# Patient Record
Sex: Male | Born: 1940 | Race: Black or African American | Hispanic: No | Marital: Married | Smoking: Former smoker
Health system: Southern US, Community
[De-identification: ages and names within clinical notes are randomized; demographics above are authoritative.]

## PROBLEM LIST (undated history)

## (undated) DIAGNOSIS — I10 Essential (primary) hypertension: Secondary | ICD-10-CM

## (undated) DIAGNOSIS — N400 Enlarged prostate without lower urinary tract symptoms: Secondary | ICD-10-CM

## (undated) DIAGNOSIS — G4730 Sleep apnea, unspecified: Secondary | ICD-10-CM

## (undated) DIAGNOSIS — M1990 Unspecified osteoarthritis, unspecified site: Secondary | ICD-10-CM

## (undated) DIAGNOSIS — E785 Hyperlipidemia, unspecified: Secondary | ICD-10-CM

## (undated) DIAGNOSIS — C801 Malignant (primary) neoplasm, unspecified: Secondary | ICD-10-CM

## (undated) DIAGNOSIS — E119 Type 2 diabetes mellitus without complications: Secondary | ICD-10-CM

## (undated) HISTORY — DX: Sleep apnea, unspecified: G47.30

## (undated) HISTORY — DX: Essential (primary) hypertension: I10

## (undated) HISTORY — DX: Benign prostatic hyperplasia without lower urinary tract symptoms: N40.0

## (undated) HISTORY — PX: JOINT REPLACEMENT: SHX530

## (undated) HISTORY — PX: PROSTATE BIOPSY: SHX241

## (undated) HISTORY — DX: Hyperlipidemia, unspecified: E78.5

## (undated) HISTORY — PX: COLON SURGERY: SHX602

## (undated) HISTORY — PX: NEPHRECTOMY: SHX65

## (undated) HISTORY — PX: CYST REMOVAL HAND: SHX6279

---

## 2006-08-25 ENCOUNTER — Ambulatory Visit: Payer: Self-pay | Admitting: Urology

## 2011-01-16 ENCOUNTER — Emergency Department: Payer: Self-pay | Admitting: Internal Medicine

## 2013-03-04 ENCOUNTER — Emergency Department: Payer: Self-pay | Admitting: Emergency Medicine

## 2013-03-04 LAB — URINALYSIS, COMPLETE
Bacteria: NONE SEEN
Bilirubin,UR: NEGATIVE
Blood: NEGATIVE
Glucose,UR: NEGATIVE mg/dL (ref 0–75)
Ketone: NEGATIVE
Nitrite: NEGATIVE
Ph: 6 (ref 4.5–8.0)
Protein: 100
RBC,UR: 1 /HPF (ref 0–5)
Specific Gravity: 1.015 (ref 1.003–1.030)
Squamous Epithelial: NONE SEEN
WBC UR: <1 /HPF (ref 0–5)

## 2013-03-14 ENCOUNTER — Inpatient Hospital Stay: Payer: Self-pay | Admitting: Internal Medicine

## 2013-03-14 LAB — URINALYSIS, COMPLETE
Bacteria: NONE SEEN
Ketone: NEGATIVE
Nitrite: NEGATIVE
Ph: 6 (ref 4.5–8.0)
Protein: 100
RBC,UR: 6691 /HPF (ref 0–5)
Specific Gravity: 1.011 (ref 1.003–1.030)
Squamous Epithelial: NONE SEEN
WBC UR: 35 /HPF (ref 0–5)

## 2013-03-14 LAB — COMPREHENSIVE METABOLIC PANEL
Albumin: 3 g/dL — ABNORMAL LOW (ref 3.4–5.0)
Alkaline Phosphatase: 113 U/L (ref 50–136)
Anion Gap: 8 (ref 7–16)
Calcium, Total: 9.5 mg/dL (ref 8.5–10.1)
Chloride: 103 mmol/L (ref 98–107)
Creatinine: 1.85 mg/dL — ABNORMAL HIGH (ref 0.60–1.30)
EGFR (Non-African Amer.): 36 — ABNORMAL LOW
Glucose: 145 mg/dL — ABNORMAL HIGH (ref 65–99)
Osmolality: 282 (ref 275–301)
Potassium: 3.7 mmol/L (ref 3.5–5.1)
SGOT(AST): 19 U/L (ref 15–37)
SGPT (ALT): 26 U/L (ref 12–78)

## 2013-03-14 LAB — CBC WITH DIFFERENTIAL/PLATELET
Eosinophil #: 0.1 10*3/uL (ref 0.0–0.7)
HGB: 13.7 g/dL (ref 13.0–18.0)
MCH: 29.2 pg (ref 26.0–34.0)
MCHC: 33.6 g/dL (ref 32.0–36.0)
Monocyte %: 6.9 %
Neutrophil #: 7.8 10*3/uL — ABNORMAL HIGH (ref 1.4–6.5)
Platelet: 165 10*3/uL (ref 150–440)
RBC: 4.71 10*6/uL (ref 4.40–5.90)
WBC: 9.4 10*3/uL (ref 3.8–10.6)

## 2013-03-14 LAB — TROPONIN I: Troponin-I: 0.1 ng/mL — ABNORMAL HIGH

## 2013-03-14 LAB — CK TOTAL AND CKMB (NOT AT ARMC): CK-MB: 3.3 ng/mL (ref 0.5–3.6)

## 2013-03-14 LAB — CK-MB: CK-MB: 2.3 ng/mL (ref 0.5–3.6)

## 2013-03-15 LAB — CBC WITH DIFFERENTIAL/PLATELET
Basophil #: 0.1 10*3/uL (ref 0.0–0.1)
HCT: 39.5 % — ABNORMAL LOW (ref 40.0–52.0)
HGB: 13 g/dL (ref 13.0–18.0)
Lymphocyte %: 15.8 %
MCHC: 33 g/dL (ref 32.0–36.0)
MCV: 88 fL (ref 80–100)
Neutrophil #: 7.3 10*3/uL — ABNORMAL HIGH (ref 1.4–6.5)
Neutrophil %: 71.4 %
Platelet: 172 10*3/uL (ref 150–440)
RBC: 4.52 10*6/uL (ref 4.40–5.90)
WBC: 10.2 10*3/uL (ref 3.8–10.6)

## 2013-03-15 LAB — LIPID PANEL
Ldl Cholesterol, Calc: 108 mg/dL — ABNORMAL HIGH (ref 0–100)
VLDL Cholesterol, Calc: 14 mg/dL (ref 5–40)

## 2013-03-15 LAB — BASIC METABOLIC PANEL
Anion Gap: 3 — ABNORMAL LOW (ref 7–16)
Co2: 29 mmol/L (ref 21–32)
Creatinine: 1.85 mg/dL — ABNORMAL HIGH (ref 0.60–1.30)
EGFR (African American): 42 — ABNORMAL LOW
Glucose: 126 mg/dL — ABNORMAL HIGH (ref 65–99)
Osmolality: 284 (ref 275–301)
Potassium: 4.5 mmol/L (ref 3.5–5.1)

## 2013-03-15 LAB — MAGNESIUM: Magnesium: 1.8 mg/dL

## 2013-03-15 LAB — TSH: Thyroid Stimulating Horm: 1.1 u[IU]/mL

## 2013-03-17 LAB — URINE CULTURE

## 2013-03-19 LAB — CULTURE, BLOOD (SINGLE)

## 2014-12-26 ENCOUNTER — Encounter: Payer: Self-pay | Admitting: *Deleted

## 2015-01-08 ENCOUNTER — Ambulatory Visit: Payer: Self-pay | Admitting: General Surgery

## 2015-01-18 ENCOUNTER — Ambulatory Visit (INDEPENDENT_AMBULATORY_CARE_PROVIDER_SITE_OTHER): Payer: Medicare Other | Admitting: General Surgery

## 2015-01-18 ENCOUNTER — Other Ambulatory Visit: Payer: Self-pay | Admitting: General Surgery

## 2015-01-18 ENCOUNTER — Encounter: Payer: Self-pay | Admitting: General Surgery

## 2015-01-18 VITALS — BP 130/70 | HR 76 | Resp 14 | Ht 71.0 in | Wt 253.0 lb

## 2015-01-18 DIAGNOSIS — Z1211 Encounter for screening for malignant neoplasm of colon: Secondary | ICD-10-CM | POA: Diagnosis not present

## 2015-01-18 MED ORDER — POLYETHYLENE GLYCOL 3350 17 GM/SCOOP PO POWD: ORAL | Status: DC

## 2015-01-18 NOTE — Patient Instructions (Addendum)
Colonoscopy A colonoscopy is an exam to look at the entire large intestine (colon). This exam can help find problems such as tumors, polyps, inflammation, and areas of bleeding. The exam takes about 1 hour.  LET YOUR HEALTH CARE PROVIDER KNOW ABOUT:   Any allergies you have.  All medicines you are taking, including vitamins, herbs, eye drops, creams, and over-the-counter medicines.  Previous problems you or members of your family have had with the use of anesthetics.  Any blood disorders you have.  Previous surgeries you have had.  Medical conditions you have. RISKS AND COMPLICATIONS  Generally, this is a safe procedure. However, as with any procedure, complications can occur. Possible complications include:  Bleeding.  Tearing or rupture of the colon wall.  Reaction to medicines given during the exam.  Infection (rare). BEFORE THE PROCEDURE   Ask your health care provider about changing or stopping your regular medicines.  You may be prescribed an oral bowel prep. This involves drinking a large amount of medicated liquid, starting the day before your procedure. The liquid will cause you to have multiple loose stools until your stool is almost clear or light green. This cleans out your colon in preparation for the procedure.  Do not eat or drink anything else once you have started the bowel prep, unless your health care provider tells you it is safe to do so.  Arrange for someone to drive you home after the procedure. PROCEDURE   You will be given medicine to help you relax (sedative).  You will lie on your side with your knees bent.  A long, flexible tube with a light and camera on the end (colonoscope) will be inserted through the rectum and into the colon. The camera sends video back to a computer screen as it moves through the colon. The colonoscope also releases carbon dioxide gas to inflate the colon. This helps your health care provider see the area better.  During  the exam, your health care provider may take a small tissue sample (biopsy) to be examined under a microscope if any abnormalities are found.  The exam is finished when the entire colon has been viewed. AFTER THE PROCEDURE   Do not drive for 24 hours after the exam.  You may have a small amount of blood in your stool.  You may pass moderate amounts of gas and have mild abdominal cramping or bloating. This is caused by the gas used to inflate your colon during the exam.  Ask when your test results will be ready and how you will get your results. Make sure you get your test results. Document Released: 09/12/2000 Document Revised: 07/06/2013 Document Reviewed: 05/23/2013 ExitCare Patient Information 2015 [employer]. This information is not intended to replace advice given to you by your health care provider. Make sure you discuss any questions you have with your health care provider.  Patient has been scheduled for a colonoscopy on 02-20-15 at [facility]. 

## 2015-01-18 NOTE — Progress Notes (Signed)
Patient ID: Gregory Ramos, male   DOB: 1941/04/13, 74 y.o.   MRN: [id number]  Chief Complaint  Patient presents with  . Colonoscopy    HPI Gregory Ramos is a 74 y.o. male.  Here today for colonoscopy discussion. He does not recall ever having a colonoscopy. Denies family history of colon cancer. Denies any gastrointestinal issues. Bowels move daily or every other day.  Contract work from 2003 - 2011 for the [employer]  Followed by Dr. Tresa Moore for his prostate at [facility] in [place]. Marland Kitchen  HPI  Past Medical History  Diagnosis Date  . Hypertension   . Hyperlipidemia   . Prostate enlargement   . Sleep apnea     resolved with weight loss    Past Surgical History  Procedure Laterality Date  . Cyst removal hand  19080  . Prostate biopsy  2000?    No family history on file.  Social History History  Substance Use Topics  . Smoking status: Former Smoker -- 1.00 packs/day for 15 years    Types: Cigarettes  . Smokeless tobacco: Never Used  . Alcohol Use: No    No Known Allergies  Current Outpatient Prescriptions  Medication Sig Dispense Refill  . alfuzosin (UROXATRAL) 10 MG 24 hr tablet Take 10 mg by mouth daily with breakfast.     . finasteride (PROSCAR) 5 MG tablet Take 5 mg by mouth daily.     Marland Kitchen lisinopril-hydrochlorothiazide (PRINZIDE,ZESTORETIC) 20-25 MG per tablet Take 1 tablet by mouth daily.     Marland Kitchen METOPROLOL TARTRATE PO Take 50 mg by mouth 2 (two) times daily.    . polyethylene glycol powder (GLYCOLAX/MIRALAX) powder 255 grams one bottle for colonoscopy prep 255 g 0   No current facility-administered medications for this visit.    Review of Systems Review of Systems  Constitutional: Negative.   Respiratory: Negative.   Cardiovascular: Negative.   Gastrointestinal: Negative.     Blood pressure 130/70, pulse 76, resp. rate 14, height 5\' 11"  (1.803 m), weight 253 lb (114.76 kg).  Physical Exam Physical Exam  Constitutional: He is oriented to person,  place, and time. He appears well-developed and well-nourished.  Neck: Neck supple.  Cardiovascular: Normal rate, regular rhythm and normal heart sounds.   Pulmonary/Chest: Effort normal and breath sounds normal.  Lymphadenopathy:    He has no cervical adenopathy.  Neurological: He is alert and oriented to person, place, and time.  Skin: Skin is warm and dry.    Data Reviewed PCP noted of December 14, 2014.   Labs notable for creatinine of 2.0 w/ e GFR of 37. Normal electrolytes. PSA 9.6.  Assessment    Candidate for screening colonoscopy.  History elevated PSA with negative prostatic biopsies in the past. Active follow-up with urology is in place.  Past history of sleep apnea requiring CPAP unit. By patient report this is no longer required.    Plan    Indications for screening colonoscopy were reviewed.    Colonoscopy with possible biopsy/polypectomy prn: Information regarding the procedure, including its potential risks and complications (including but not limited to perforation of the bowel, which may require emergency surgery to repair, and bleeding) was verbally given to the patient. Educational information regarding lower instestinal endoscopy was given to the patient. Written instructions for how to complete the bowel prep using Miralax were provided. The importance of drinking ample fluids to avoid dehydration as a result of the prep emphasized.  Patient has been scheduled for a colonoscopy on 02-20-15 at [facility].  PCP/Ref:  Faylene Kurtz 01/18/2015, 7:07 PM

## 2015-01-19 NOTE — Consult Note (Signed)
PATIENT NAME:  Gregory Ramos, Gregory Ramos MR#:  [id number] DATE OF BIRTH:  [birth year]  DATE OF CONSULTATION:  03/14/2013  REFERRING PHYSICIAN:  Dr. Margaretmary Eddy.  CONSULTING PHYSICIAN:  Scott C. Bernardo Heater, MD  REASON FOR CONSULTATION: Urinary retention, BPH.   HISTORY OF PRESENT ILLNESS: This is a 74 year old African American male who presented to the Emergency Department on 03/04/2013 with acute urinary retention and had a Foley catheter placed. I have seen him previously; however, he apparently could not get a followup appointment with me and saw Dr. Tresa Moore at [facility] in [place] last week. It was recommended that his catheter be left indwelling, and he was started on a "blue pill" which would "shrink his bladder and prostate." He has a followup appointment scheduled with Dr. Tresa Moore on June 26th; however, went to bed last night and was awakened with shaking chills and fever to 103 degrees. He was admitted for IV antibiotics. CT of the abdomen and pelvis without contrast showed severe bladder wall thickening and inflammatory changes. There was noted to be prostate enlargement. He was found to have bilateral sclerotic lesions of the ileum. His Foley catheter was removed in the Emergency Department. He states he has been voiding.   I initially saw him in 2007 for BPH and an elevated PSA. He had a prior prostate biopsy at the [place] in 2002 for a PSA of 5.1, with benign pathology. He underwent a repeat biopsy by me in 2010 for a PSA of 7.2. Prostate volume at that time was 122 mL. He was started on Avodart at that time; however, discontinued this medication in January 2012. He has been on alfuzosin. He was last seen by me on 12/24/2012. DRE at that time was benign. PSA at that visit was stable at 7.9. Bladder volume when the catheter was placed in the Emergency Room was 825 mL.   PAST MEDICAL HISTORY:  1. Hypertension.  2. BPH.   3. Elevated PSA.   PAST SURGERY: He has had prior surgery on digits of the upper  extremity.   ALLERGIES: NKDA.    MEDICATIONS ON ADMISSION: Lopressor 50 mg b.i.d., hydrochlorothiazide daily, amlodipine 5 mg daily, alfuzosin 10 mg daily.   SOCIAL HISTORY: No tobacco use. Denies alcohol use.   FAMILY HISTORY: Positive for hypertension.   REVIEW OF SYSTEMS:  GENERAL: Positive fever, fatigue, weakness.  EYES: No visual changes.  ENT: No epistaxis.  PULMONARY: Denies cough, shortness breath.  GASTROINTESTINAL: Denies nausea, vomiting, abdominal pain.  GENITOURINARY: As per the HPI.   HEMATOLOGY: No history of bleeding or clotting disorders.  MUSCULOSKELETAL: No joint pain.  NEUROLOGIC: No stroke, seizure.  PSYCHIATRIC: No depression, anxiety.   PHYSICAL EXAMINATION:  VITAL SIGNS: Temp 98.6, pulse 77, BP 150/79.  GENERAL: The patient is in no acute distress.  ABDOMEN: Soft and nontender.  PROSTATE: Exam was deferred at this time.   IMPRESSION:  1. Benign prostatic hypertrophy.  2. Urinary retention.  3. Acute prostatitis, most likely secondary to indwelling Foley.   RECOMMENDATIONS:  1. Continue IV antibiotics pending urine culture sensitivity.  2. A PSA in March 2014 was stable at 7.9. Would not repeat a PSA at this time with recent catheterization and infection.  3. His Foley catheter was removed in the Emergency Department. He needs to be followed closely for recurrent urinary retention and at present, the Foley catheter would need to be replaced.  4. Will follow.   ____________________________ Ronda Fairly Bernardo Heater, MD scs:gb D: 03/14/2013 19:49:39 ET T: 03/14/2013 22:50:03 ET JOB#:  [id number]  cc: Nicki Reaper C. Bernardo Heater, MD, <Dictator> Abbie Sons MD ELECTRONICALLY SIGNED 03/23/2013 13:20

## 2015-01-19 NOTE — Discharge Summary (Signed)
PATIENT NAME:  Gregory Ramos, Gregory Ramos MR#:  [id number] DATE OF BIRTH:  1941-01-21  DATE OF ADMISSION:  03/14/2013 DATE OF DISCHARGE:  03/15/2013  ADMISSION DIAGNOSES:  1.  Sepsis from acute cystitis/proctitis.  2.  Cystitis likely secondary to indwelling Foley catheter.  3.  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  4.  History of benign prostatic hypertrophy.  5.  Elevated troponin.   CONSULTANTS:  1.  Cardiology, Dr. Serafina Royals.  2.  Dr. John Giovanni.  DISCHARGE LABORATORY AND DIAGNOSTICS: LDL 108, VLDL 14, HDL 40, triglyceride 68, cholesterol 162.  White blood cells 10.2, hemoglobin 13, hematocrit 39.4, platelets 172. Sodium 140, potassium 4.5, chloride 108, bicarb 29, BUN 22, creatinine 1.85, glucose 26.  Magnesium 1.8. TSH 1.10. Troponin max 0.22, at discharge 0.14.  2-D echocardiogram showed an EF 55% to 60%, normal global left ventricular systolic function with mild mitral valve regurg, severely increased left ventricular posterior wall thickness.  CT of the abdomen and pelvis showed cystitis.  No acute abnormality.   HOSPITAL COURSE: This is a 74 year old male who presented with dehydration and sepsis. For further details, please refer to the H and P.  1.  Sepsis from acute cystitis and prostatitis. The patient initially presented with low blood pressure and elevated troponin.  A CT of the abdomen showed cystitis. Urology was consulted. They felt this may be more prostatitis. His blood cultures are negative to date. There was no urine culture. He was doing well on Rocephin. He was discharged with Keflex.  2.  Cystitis secondary to his Foley catheter that had been placed June 6th for BPH.  His Foley was DC'd and he actually urinated well and we continued him on antibiotics. He was afebrile throughout the hospitalization.  3.  Renal failure. Likely this is chronic as his creatinine remained stable. This is likely stage III.  4.  History of BPH.  We appreciate urology consult. We will  continue Flomax.  5.  Elevated troponin from demand ischemia. He initially presented with SVT which resolved in the ER. His echo was pretty much unremarkable and there is no further cardiac work-up at this time, per cardiology.  6.  SVT, which resolved.   DISCHARGE MEDICATIONS: 1.  Alfuzosin 10 mg daily.  2.  Finasteride 5 mg daily.  3.  HCTZ/lisinopril 25/20 mg daily.  4.  Metoprolol 50 mg b.i.d.  5.  Keflex 500 mg t.i.d. for 12 days.   DISCHARGE DIET: Low sodium.  DISCHARGE ACTIVITY: As tolerated.   DISCHARGE FOLLOWUP:  The patient can follow up with Dr. Bernardo Heater in 3 to 4 weeks and Dr. Brynda Greathouse in 1 to 2 weeks.   TIME SPENT: Approximately 35 minutes.  ____________________________ Donell Beers. Benjie Karvonen, MD spm:sb D: 03/15/2013 15:47:40 ET T: 03/15/2013 16:13:21 ET JOB#: [id number]  cc: Reann Dobias P. Benjie Karvonen, MD, <Dictator> Mikeal Hawthorne. Brynda Greathouse, MD Donell Beers Liza Czerwinski MD ELECTRONICALLY SIGNED 03/15/2013 20:32

## 2015-01-19 NOTE — Consult Note (Signed)
Brief Consult Note: Diagnosis: BPH/Urinary retention/Acute prostatitis.   Patient was seen by consultant.   Consult note dictated.   Comments: Catheter was removed in ED. Monitor for recurrent retention.  Electronic Signatures: Abbie Sons (MD)  (Signed 16-Jun-14 19:51)  Authored: Brief Consult Note   Last Updated: 16-Jun-14 19:51 by Abbie Sons (MD)

## 2015-01-19 NOTE — H&P (Signed)
PATIENT NAME:  Gregory Ramos, Gregory Ramos MR#:  [id number] DATE OF BIRTH:  [birth year]  DATE OF ADMISSION:  03/14/2013  PRIMARY CARE PHYSICIAN:  Nicky Pugh, MD  REFERRING PHYSICIAN:  Lurline Hare, MD  CHIEF COMPLAINT: Fever with chills.   HISTORY OF PRESENT ILLNESS: The patient is a 74 year old African American male with a past medical history of benign prostatic hypertrophy with history of urinary retention who had an indwelling Foley catheter placed from June 6th and hypertension who is presenting to the ER with the chief complaint of fever and chills. The patient was in deep sleep and awoke with fever and chills. He has been following up with some urologist and could not recall his name and he is taking some medicine to shrink the prostate, but not on any antibiotics. The patient could not recall his urologist's name. In the ER, the patient's temperature was 103 degrees Fahrenheit and he was tachycardic with SVT at 140. Eventually with medicine and IV fluids heart rate trended down to 120, but the patient was in accelerated junctional rhythm. Urinalysis was positive for infection, and the patient was given IV ciprofloxacin. CAT scan of the abdomen was done which has revealed acute cystitis. Renal function is slightly worse which has trended up from 1.61 to 1.85.  The patient's troponin is elevated at 0.1 with no acute ST-T elevations or depressions. The patient has reported that he felt a little short of breath when he was tachycardic and febrile, but during my examination he is absolutely fine and resting comfortably. Wife is at bedside. Denies any other complaints. No chest pain. Denies any abdominal pain, nausea, vomiting, weight gain or weight loss.   PAST MEDICAL HISTORY:  1.  Hypertension. 2.  Benign prostatic hypertrophy with history of urinary retention and has been followed up by urology locally for shrinking his prostate.   PAST SURGICAL HISTORY: Finger surgery.  ALLERGIES: No known drug allergies.    HOME MEDICATIONS:  1.  Lopressor 50 mg p.o. b.i.d. 2.  Hydrochlorothiazide once daily. 3.  Amlodipine 5 mg once daily. 4.  Alfuzosin 10 mg once daily.   PSYCHOSOCIAL HISTORY: Lives at home with wife. He used to smoke but quit smoking. Denies alcohol or illicit drug usage.   FAMILY HISTORY: Hypertension runs in his family.  Parents and brothers, all of them have hypertension. Also prostate hypertrophy history runs in his family.   REVIEW OF SYSTEMS: CONSTITUTIONAL: Complaining of fever, fatigue and weakness, but denies any pain, weight loss, weight gain.  EYES: Denies any blurry vision, glaucoma, cataracts.  ENT: Denies any epistaxis, discharge, snoring, postnasal drip, difficulty in swallowing. RESPIRATORY:  Denies any cough, wheezing, hemoptysis. The patient was short of breath transiently when he was febrile and tachycardic.  GASTROINTESTINAL: Denies nausea, vomiting, diarrhea, abdominal pain. No hematemesis.  GENITOURINARY: Has a history of prostate hypertrophy with urinary retention. Denies any hernia repair.   ENDOCRINE:  Denies any polyuria, nocturia or thyroid problems.  HEMATOLOGIC AND LYMPHATIC: Denies any anemia, easy bruising or bleeding.  INTEGUMENTARY: No acne, rash, lesions.  MUSCULOSKELETAL: No joint pain in the back, neck or shoulders. Denies any gout. Denies any limited activity. NEUROLOGIC:  Denies any vertigo, ataxia, dementia, headache or blurry vision. PSYCH:  Denies any ADD, OCD or bipolar disorder.   PHYSICAL EXAMINATION: VITAL SIGNS: Temperature 98.9, pulse 91, respirations 20, blood pressure 112/73 and pulse ox 98%.  GENERAL APPEARANCE: Not in acute distress. Moderately built and obese.  HEENT: Normocephalic, atraumatic. Pupils are equally reacting to light and  accommodation. No scleral icterus. No conjunctival injection. No postnasal drip.  No pharyngeal exudates.  NECK: Supple. No JVD. No thyromegaly. No lymphadenopathy.  LUNGS: Clear to auscultation  bilaterally. No accessory muscle usage.  No anterior chest wall tenderness on palpation.  HEART:  S1 and S2 normal with regular rate and rhythm. No murmurs.  ABDOMEN:  Soft. Bowel sounds are positive in all 4 quadrants. Nontender, nondistended. No masses felt. No hepatosplenomegaly.  NEUROLOGIC: Awake, alert and oriented x 3. Cranial nerves II through XII are intact. Motor and sensory are intact. Reflexes are 2+.  EXTREMITIES: No edema. No cyanosis. No clubbing. Intact indwelling Foley catheter.  BACK: No CVA tenderness.  SKIN: No rashes. No lesions. Normal turgor.  MUSCULOSKELETAL: No joint effusion, tenderness or erythema.  PSYCHIATRIC: Normal mood and affect.   LABORATORY AND DIAGNOSTICS: CAT scan of the abdomen and pelvis without contrast has revealed Foley catheter in the bladder, severe bladder wall thickening and paracystic inflammatory changes concerning for cystitis. Prostate gland is enlarged. Multiple bilateral lower lobe pulmonary nodules similar in appearance as seen 01/16/2011. Bilateral sclerotic ilial lesions.  Appearance is nonspecific, but sclerotic malignancy cannot be excluded. Correlate with PFC.  Glucose 145, BUN 24, creatinine 1.85, sodium 138, potassium 3.7, chloride 103, CO2 27, GFR 42, serum osmolality 282, calcium 9.5. LFTs are within normal range, except albumin which is low. CPK-MB 2.3, troponin 0.10. WBC 9.4, hemoglobin 13.7, hematocrit 40.9, platelets 165. Urinalysis:  Amber color, cloudy in appearance, leukocyte esterase and nitrite negative. No WBC clumps.  Twelve-lead EKG has revealed SVT initially at 145 beats per minute at 4 hours, 53 seconds and repeat EKG at 4 hours and 48 minutes has revealed accelerated junctional rhythm with stroke wave conduction at 118 beats per minute.   ASSESSMENT AND PLAN: A 74 year old African American male presenting to the ER with the chief complaint of fever, tachycardia and shortness of breath. Will be admitted with the following  assessment and plan:  1.  Sepsis with tachycardia from acute cystitis. Will give him IV Rocephin.  A urine culture is ordered.  2.  Elevated troponin. Probably from demand ischemia from supraventricular tachycardia and accelerated junctional rhythm. The patient denies any chest pain. Transient shortness of breath is resolved. Will rule out acute myocardial infarction. We will implement ACS protocol, except beta blocker as the patient has accelerated junctional rhythm.  Put a cardiology consult to on-call cardiology. We will obtain 2-D echocardiogram. Lovenox 1 mg/kg subcutaneous x 1 is given until troponins are cycled x 3 and if they are trending up then will need to consider further anticoagulation options.  3.  Acute renal insufficiency. Will provide IV fluids and monitor renal function closely. Avoid nephrotoxins.  4.  Urinary retention from benign prostatic hypertrophy and bilateral sclerotic lytic lesions in the ilium.  Discontinue indwelling Foley catheter in view of sepsis from acute cystitis. We will get a bladder scan in 3 to 4 hours to see if the patient is retaining urine. If necessary will insert Foley catheter.  Urology consult is placed regarding sclerotic lytic lesions and urinary retention.  5.  Hypertension. We will resume home medication, except beta blocker. The patient is a FULL CODE.  We will provide the patient gastrointestinal and deep venous thrombosis prophylaxis.  6.  History of pulmonary nodules. This needs to be investigated further by the primary care physician.   The diagnosis and plan of care was discussed in detail with the patient and his wife at bedside. They both verbalized  understanding of the plan.  TOTAL TIME SPENT ON ADMISSION: 50 minutes.  ____________________________ Nicholes Mango, MD ag:sb D: 03/14/2013 07:56:27 ET T: 03/14/2013 08:39:49 ET JOB#: [id number]  cc: Nicholes Mango, MD, <Dictator> Mikeal Hawthorne. Brynda Greathouse, MD Corey Skains, MD  Nicholes Mango  MD ELECTRONICALLY SIGNED 03/14/2013 23:04

## 2015-01-19 NOTE — Consult Note (Signed)
PATIENT NAME:  Gregory Ramos, SOBOTTA MR#:  [id number] DATE OF BIRTH:  07-Oct-1940  DATE OF CONSULTATION:  03/14/2013  REFERRING PHYSICIAN:  Fulton Reek, MD CONSULTING PHYSICIAN:  Corey Skains, MD  REASON FOR CONSULTATION: Elevated troponin with tachycardia.   CHIEF COMPLAINT: "I had palpitations."   HISTORY OF PRESENT ILLNESS:  This is a 74 year old male with known borderline hypertension and hyperlipidemia who has had significant issues with his urinary tract with bladder concerns and recent bladder infection. He had fever, weakness, palpitations and was seen in the Emergency Room.  The patient at that time had EKG showing supraventricular tachycardia and now has spontaneously converted to normal sinus rhythm with no evidence of chest discomfort.  The patient has been short. This is all new onset. The patient did have an elevation of troponin of 0.22 most consistent with demand ischemia rather than acute myocardial infarction. Creatinine was 1.8 and consistent with recent infection. He has not had any hypertension requiring further intervention or hyperlipidemia.   REVIEW OF SYSTEMS: The remainder is negative for vision change, ringing in the ears, hearing loss, cough, congestion, heartburn, nausea, vomiting, diarrhea, bloody stools, stomach pain, extremity pain, leg weakness, cramping of the buttocks, known blood clots, headaches, blackouts, dizzy spells, nosebleed, congestion, trouble swallowing, frequent urination, urination at night, muscle weakness, numbness, anxiety, depression, skin lesions or skin rashes.   PAST MEDICAL HISTORY: 1.  Chronic kidney disease.  2.  Bladder infection.   FAMILY HISTORY: No family members with early onset of cardiovascular disease.   SOCIAL HISTORY: Currently denies alcohol or tobacco use.   ALLERGIES: As listed.   MEDICATIONS: As listed.   PHYSICAL EXAMINATION: VITAL SIGNS: Blood pressure 110/68 bilaterally and heart rate is 72 upright, reclining and  regular.  GENERAL: He is a well appearing male in no acute distress.  HEENT: No icterus, thyromegaly, ulcers, hemorrhage or xanthelasma.  HEART:  Regular rate and rhythm with normal S1 and S2 without murmur, gallop or rub. PMI is normal size and placement. Carotid upstroke is normal without bruit. Jugular venous pressure is normal.  LUNGS: A few basilar crackles with normal respirations.  ABDOMEN: Soft and nontender without hepatosplenomegaly or masses. Abdominal aorta is normal size without bruit.  EXTREMITIES: 2+ bilateral pulses in dorsal, pedal, radial and femoral arteries without lower extremity edema, cyanosis, clubbing or ulcers.  NEUROLOGIC: He is oriented to time, place and person with normal mood and affect.   ASSESSMENT: This is a 74 year old male with borderline hypertension and hyperlipidemia with new onset of supraventricular tachycardia now spontaneous converted to normal rhythm with chronic kidney disease and elevated troponin needing further treatment options.   RECOMMENDATIONS: 1.  No further intervention of tachycardia, likely secondary to infection and/or hypotension.  2.  No further intervention of minimal elevation of troponin most consistent with demand ischemia rather than acute myocardial infarction.  3.  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  4.  Continue infection treatment for above.  ____________________________ Corey Skains, MD bjk:sb D: 03/14/2013 13:56:56 ET T: 03/14/2013 14:30:41 ET JOB#: [id number]  cc: Corey Skains, MD, <Dictator> Corey Skains MD ELECTRONICALLY SIGNED 03/24/2013 8:48

## 2015-02-14 ENCOUNTER — Telehealth: Payer: Self-pay | Admitting: *Deleted

## 2015-02-14 NOTE — Telephone Encounter (Signed)
Patient reports no medication changes since last office visit.   This patient does have Miralax prescription but has yet to pre-register. Patient was encouraged to call and pre-register by Friday.   We will proceed with colonoscopy that is scheduled on 02-20-15 at [facility]. He was instructed to call should he have further questions.

## 2015-02-20 ENCOUNTER — Encounter
Admission: RE | Disposition: A | Discharge status: Home / Self Care | Payer: Self-pay | Source: Ambulatory Visit | Attending: General Surgery

## 2015-02-20 ENCOUNTER — Ambulatory Visit: Payer: Medicare Other | Admitting: Anesthesiology

## 2015-02-20 ENCOUNTER — Ambulatory Visit
Admission: RE | Admit: 2015-02-20 | Discharge: 2015-02-20 | Disposition: A | Discharge status: Home / Self Care | Payer: Medicare Other | Source: Ambulatory Visit | Attending: General Surgery | Admitting: General Surgery

## 2015-02-20 DIAGNOSIS — Z79899 Other long term (current) drug therapy: Secondary | ICD-10-CM | POA: Insufficient documentation

## 2015-02-20 DIAGNOSIS — Z87891 Personal history of nicotine dependence: Secondary | ICD-10-CM | POA: Insufficient documentation

## 2015-02-20 DIAGNOSIS — D125 Benign neoplasm of sigmoid colon: Secondary | ICD-10-CM | POA: Insufficient documentation

## 2015-02-20 DIAGNOSIS — N400 Enlarged prostate without lower urinary tract symptoms: Secondary | ICD-10-CM | POA: Insufficient documentation

## 2015-02-20 DIAGNOSIS — D122 Benign neoplasm of ascending colon: Secondary | ICD-10-CM | POA: Insufficient documentation

## 2015-02-20 DIAGNOSIS — D124 Benign neoplasm of descending colon: Secondary | ICD-10-CM | POA: Insufficient documentation

## 2015-02-20 DIAGNOSIS — Z1211 Encounter for screening for malignant neoplasm of colon: Secondary | ICD-10-CM

## 2015-02-20 DIAGNOSIS — E785 Hyperlipidemia, unspecified: Secondary | ICD-10-CM | POA: Diagnosis not present

## 2015-02-20 DIAGNOSIS — I10 Essential (primary) hypertension: Secondary | ICD-10-CM | POA: Diagnosis not present

## 2015-02-20 DIAGNOSIS — D127 Benign neoplasm of rectosigmoid junction: Secondary | ICD-10-CM | POA: Diagnosis not present

## 2015-02-20 HISTORY — PX: COLONOSCOPY: SHX5424

## 2015-02-20 SURGERY — COLONOSCOPY
Anesthesia: General

## 2015-02-20 MED ORDER — SODIUM CHLORIDE 0.9 % IV SOLN
INTRAVENOUS | Status: DC
  Administered 2015-02-20: 1000 mL via INTRAVENOUS

## 2015-02-20 MED ORDER — LIDOCAINE HCL (CARDIAC) 20 MG/ML IV SOLN
INTRAVENOUS | Status: DC | PRN
  Administered 2015-02-20: 60 mg via INTRAVENOUS

## 2015-02-20 MED ORDER — SODIUM CHLORIDE 0.9 % IV SOLN
INTRAVENOUS | Status: DC
  Administered 2015-02-20: 16:00:00 via INTRAVENOUS

## 2015-02-20 MED ORDER — MIDAZOLAM HCL 2 MG/2ML IJ SOLN
INTRAMUSCULAR | Status: DC | PRN
  Administered 2015-02-20: 1 mg via INTRAVENOUS

## 2015-02-20 MED ORDER — PROPOFOL 10 MG/ML IV BOLUS
INTRAVENOUS | Status: DC | PRN
  Administered 2015-02-20: 70 mg via INTRAVENOUS

## 2015-02-20 MED ORDER — PROPOFOL INFUSION 10 MG/ML OPTIME
INTRAVENOUS | Status: DC | PRN
  Administered 2015-02-20: 140 ug/kg/min via INTRAVENOUS

## 2015-02-20 NOTE — H&P (Signed)
Healthy 74 y/o male for screening colonoscopy. HEENT: Neg. Lungs: Clear. Cardio: RR. Plan: Screening colonoscopy.

## 2015-02-20 NOTE — Anesthesia Postprocedure Evaluation (Signed)
  Anesthesia Post-op Note  [name]  Procedure(s) Performed: Procedure(s): COLONOSCOPY (N/A)  Anesthesia type:General  Patient location: PACU  Post pain: Pain level controlled  Post assessment: Post-op Vital signs reviewed, Patient's Cardiovascular Status Stable, Respiratory Function Stable, Patent Airway and No signs of Nausea or vomiting  Post vital signs: Reviewed and stable  Last Vitals:  Filed Vitals:   02/20/15 1640  BP:   Pulse: 65  Temp: 36.4 C  Resp: 11    Level of consciousness: awake, alert  and patient cooperative  Complications: No apparent anesthesia complications

## 2015-02-20 NOTE — Anesthesia Preprocedure Evaluation (Signed)
Anesthesia Evaluation  Patient identified by MRN, date of birth, ID band Patient awake    Reviewed: Allergy & Precautions, NPO status , Patient's Chart, lab work & pertinent test results  Airway Mallampati: III  TM Distance: >3 FB Neck ROM: Full    Dental  (+) Missing   Pulmonary sleep apnea (pt does not use CPAP) and Continuous Positive Airway Pressure Ventilation , former smoker (quit 33 yrs),          Cardiovascular hypertension, Pt. on medications and Pt. on home beta blockers     Neuro/Psych    GI/Hepatic   Endo/Other    Renal/GU      Musculoskeletal   Abdominal   Peds  Hematology   Anesthesia Other Findings   Reproductive/Obstetrics                             Anesthesia Physical Anesthesia Plan  ASA: III  Anesthesia Plan: General   Post-op Pain Management:    Induction: Intravenous  Airway Management Planned: Nasal Cannula  Additional Equipment:   Intra-op Plan:   Post-operative Plan:   Informed Consent: I have reviewed the patients History and Physical, chart, labs and discussed the procedure including the risks, benefits and alternatives for the proposed anesthesia with the patient or authorized representative who has indicated his/her understanding and acceptance.     Plan Discussed with:   Anesthesia Plan Comments:         Anesthesia Quick Evaluation

## 2015-02-20 NOTE — Op Note (Signed)
[facility] Gastroenterology Patient Name: Gregory Ramos Procedure Date: 02/20/2015 3:21 PM MRN: [id number] Account #: [id number] Date of Birth: 1940/11/05 Admit Type: Outpatient Age: 74 Room: [facility] ENDO ROOM 1 Gender: Male Note Status: Finalized Procedure:         Colonoscopy Indications:       Screening for colorectal malignant neoplasm Providers:         Robert Bellow, MD Referring MD:      Mikeal Hawthorne. Brynda Greathouse, MD (Referring MD) Medicines:         Monitored Anesthesia Care Complications:     No immediate complications. Procedure:         Pre-Anesthesia Assessment:                    - Prior to the procedure, a History and Physical was                     performed, and patient medications, allergies and                     sensitivities were reviewed. The patient's tolerance of                     previous anesthesia was reviewed.                    - The risks and benefits of the procedure and the sedation                     options and risks were discussed with the patient. All                     questions were answered and informed consent was obtained.                    After obtaining informed consent, the colonoscope was                     passed under direct vision. Throughout the procedure, the                     patient's blood pressure, pulse, and oxygen saturations                     were monitored continuously. The Olympus CF-H180AL                     colonoscope ( S#: [id number] ) was introduced through the                     anus and advanced to the the cecum, identified by                     appendiceal orifice and ileocecal valve. The colonoscopy                     was performed with moderate difficulty due to significant                     looping and a tortuous colon. Successful completion of the                     procedure was aided by using manual pressure. The patient  tolerated the procedure well. The quality  of the bowel                     preparation was excellent. Findings:      A 7 mm polyp was found in the ascending colon. The polyp was sessile.       Biopsies were taken with a cold forceps for histology.      A 9 mm polyp was found in the recto-sigmoid colon. The polyp was       pedunculated. The polyp was removed with a hot snare. Resection and       retrieval were complete.      A 30 mm polyp was found in the descending colon in the distal descending       colon. The polyp was semi-pedunculated. Approximately 90% of the polyp       was removed. Resection was incomplete secondary to location on a fold.       Area was successfully injected with 4 mL [place] ink for tattooing. Impression:        - One 7 mm polyp in the ascending colon. Biopsied.                    - One 9 mm polyp at the recto-sigmoid colon. Resected and                     retrieved.                    - One 25 mm polyp in the descending colon in the distal                     descending colon. Injected. Recommendation:    - Telephone endoscopist for pathology results in 1 week. Procedure Code(s): --- Professional ---                    [phone], Colonoscopy, flexible; with removal of tumor(s),                     polyp(s), or other lesion(s) by snare technique                    45380, 2, Colonoscopy, flexible; with biopsy, single or                     multiple                    45381, Colonoscopy, flexible; with directed submucosal                     injection(s), any substance Diagnosis Code(s): --- Professional ---                    Z12.11, Encounter for screening for malignant neoplasm of                     colon                    D12.2, Benign neoplasm of ascending colon                    D12.7, Benign neoplasm of rectosigmoid junction                    D12.4, Benign neoplasm of descending colon CPT  copyright 2014 American Medical Association. All rights reserved. The codes documented in this report are  preliminary and upon coder review may  be revised to meet current compliance requirements. Robert Bellow, MD 02/20/2015 4:33:36 PM This report has been signed electronically. Number of Addenda: 0 Note Initiated On: 02/20/2015 3:21 PM Scope Withdrawal Time: 0 hours 23 minutes 16 seconds  Total Procedure Duration: 0 hours 41 minutes 26 seconds       [facility]

## 2015-02-20 NOTE — Transfer of Care (Signed)
Immediate Anesthesia Transfer of Care Note  [name]  Procedure(s) Performed: Procedure(s): COLONOSCOPY (N/A)  Patient Location: PACU  Anesthesia Type:General  Level of Consciousness: sedated  Airway & Oxygen Therapy: Patient Spontanous Breathing and Patient connected to nasal cannula oxygen  Post-op Assessment: Report given to RN and Post -op Vital signs reviewed and stable  Post vital signs: Reviewed and stable  Last Vitals:  Filed Vitals:   02/20/15 1636  BP: 114/77  Pulse: 65  Temp: 0000000    Complications: No apparent anesthesia complications

## 2015-02-22 ENCOUNTER — Encounter: Payer: Self-pay | Admitting: General Surgery

## 2015-02-23 ENCOUNTER — Telehealth: Payer: Self-pay

## 2015-02-23 LAB — SURGICAL PATHOLOGY

## 2015-02-23 NOTE — Telephone Encounter (Signed)
Notified patient as instructed, patient pleased. Discussed follow-up Colonoscopy in one year, patient agrees. Patient placed in recalls.

## 2015-02-23 NOTE — Telephone Encounter (Signed)
-----   Message from Robert Bellow, MD sent at 02/23/2015 10:52 AM EDT ----- Please notify the patient that the polyp removed at the time of his endoscopy on May 24 were benign. Because of the number of polyps in the colon, a repeat exam in one year's recommended.

## 2015-11-13 ENCOUNTER — Encounter: Payer: Self-pay | Admitting: *Deleted

## 2016-02-26 ENCOUNTER — Ambulatory Visit: Payer: Self-pay | Admitting: General Surgery

## 2016-03-11 ENCOUNTER — Encounter: Payer: Self-pay | Admitting: *Deleted

## 2016-03-18 ENCOUNTER — Ambulatory Visit: Payer: Self-pay | Admitting: General Surgery

## 2016-06-25 ENCOUNTER — Encounter: Payer: Self-pay | Admitting: *Deleted

## 2016-08-25 ENCOUNTER — Encounter: Payer: Self-pay | Admitting: *Deleted

## 2016-08-27 ENCOUNTER — Ambulatory Visit: Payer: Self-pay | Admitting: General Surgery

## 2016-11-04 ENCOUNTER — Encounter: Payer: Self-pay | Admitting: Emergency Medicine

## 2016-11-04 ENCOUNTER — Emergency Department
Admission: EM | Admit: 2016-11-04 | Discharge: 2016-11-04 | Disposition: A | Discharge status: Home / Self Care | Payer: Medicare Other | Attending: Student in an Organized Health Care Education/Training Program | Admitting: Student in an Organized Health Care Education/Training Program

## 2016-11-04 DIAGNOSIS — R1030 Lower abdominal pain, unspecified: Secondary | ICD-10-CM | POA: Insufficient documentation

## 2016-11-04 DIAGNOSIS — R339 Retention of urine, unspecified: Secondary | ICD-10-CM | POA: Diagnosis not present

## 2016-11-04 DIAGNOSIS — I10 Essential (primary) hypertension: Secondary | ICD-10-CM | POA: Diagnosis not present

## 2016-11-04 DIAGNOSIS — Z87891 Personal history of nicotine dependence: Secondary | ICD-10-CM | POA: Insufficient documentation

## 2016-11-04 DIAGNOSIS — Z79899 Other long term (current) drug therapy: Secondary | ICD-10-CM | POA: Diagnosis not present

## 2016-11-04 DIAGNOSIS — R102 Pelvic and perineal pain: Secondary | ICD-10-CM

## 2016-11-04 LAB — URINALYSIS, COMPLETE (UACMP) WITH MICROSCOPIC
Bacteria, UA: NONE SEEN
Bilirubin Urine: NEGATIVE
GLUCOSE, UA: NEGATIVE mg/dL
KETONES UR: NEGATIVE mg/dL
Leukocytes, UA: NEGATIVE
NITRITE: NEGATIVE
Protein, ur: 30 mg/dL — AB
SPECIFIC GRAVITY, URINE: 1.012 (ref 1.005–1.030)
pH: 5 (ref 5.0–8.0)

## 2016-11-04 MED ORDER — LIDOCAINE HCL 2 % EX GEL
1.0000 "application " | Freq: Once | CUTANEOUS | Status: AC
  Administered 2016-11-04: 1 via URETHRAL
  Filled 2016-11-04: qty 5

## 2016-11-04 MED ORDER — LIDOCAINE HCL 2 % EX GEL
CUTANEOUS | Status: AC
  Administered 2016-11-04: 1 via URETHRAL
  Filled 2016-11-04: qty 10

## 2016-11-04 NOTE — ED Provider Notes (Signed)
[facility] Emergency Department Provider Note    First MD Initiated Contact with Patient 11/04/16 1035     (approximate)  I have reviewed the triage vital signs and the nursing notes.   HISTORY  Chief Complaint Urinary Retention    HPI Gregory Ramos is a 76 y.o. male acute urinary retention starting last evening. Patient on alfuzosin and finasteride chronically for the past several years due to enlarged prostate. States he ran out of his stat finasteride 2 days ago. Went to get a refill yesterday and started having difficulty urinating. States last time he was able to urinate was last night. Denies any fevers. States pain is 10 out of 10. No flank pain. No nausea or vomiting. No blood in his urine.   Past Medical History:  Diagnosis Date  . Hyperlipidemia   . Hypertension   . Prostate enlargement   . Sleep apnea    resolved with weight loss   History reviewed. No pertinent family history. Past Surgical History:  Procedure Laterality Date  . COLONOSCOPY N/A 02/20/2015   Procedure: COLONOSCOPY;  Surgeon: Robert Bellow, MD;  Location: [facility] ENDOSCOPY;  Service: Endoscopy;  Laterality: N/A;  . CYST REMOVAL HAND  24235  . PROSTATE BIOPSY  2000?   Patient Active Problem List   Diagnosis Date Noted  . Encounter for screening colonoscopy 01/18/2015      Prior to Admission medications   Medication Sig Start Date End Date Taking? Authorizing Provider  alfuzosin (UROXATRAL) 10 MG 24 hr tablet Take 10 mg by mouth daily with breakfast.  12/05/14   Historical Provider, MD  finasteride (PROSCAR) 5 MG tablet Take 5 mg by mouth daily.  11/07/14   Historical Provider, MD  lisinopril-hydrochlorothiazide (PRINZIDE,ZESTORETIC) 20-25 MG per tablet Take 1 tablet by mouth daily.  01/02/15   Historical Provider, MD  METOPROLOL TARTRATE PO Take 50 mg by mouth 2 (two) times daily.    Historical Provider, MD    Allergies Patient has no known allergies.    Social  History Social History  Substance Use Topics  . Smoking status: Former Smoker    Packs/day: 1.00    Years: 15.00    Types: Cigarettes  . Smokeless tobacco: Never Used  . Alcohol use No    Review of Systems Patient denies headaches, rhinorrhea, blurry vision, numbness, shortness of breath, chest pain, edema, cough, abdominal pain, nausea, vomiting, diarrhea, dysuria, fevers, rashes or hallucinations unless otherwise stated above in HPI. ____________________________________________   PHYSICAL EXAM:  VITAL SIGNS: Vitals:   11/04/16 1100 11/04/16 1126  BP: (!) 171/107 (!) 151/87  Pulse: 76 68  Resp: 20 16  Temp:      Constitutional: Alert and oriented. Uncomfortable appearing but in no acute distress. Eyes: Conjunctivae are normal. PERRL. EOMI. Head: Atraumatic. Nose: No congestion/rhinnorhea. Mouth/Throat: Mucous membranes are moist.  Oropharynx non-erythematous. Neck: No stridor. Painless ROM. No cervical spine tenderness to palpation Hematological/Lymphatic/Immunilogical: No cervical lymphadenopathy. Cardiovascular: Normal rate, regular rhythm. Grossly normal heart sounds.  Good peripheral circulation. Respiratory: Normal respiratory effort.  No retractions. Lungs CTAB. Gastrointestinal: disteded bladder with ttp. No distention. No abdominal bruits. No CVA tenderness. Genitourinary: normal external genitalia, no blood at the meatus Musculoskeletal: No lower extremity tenderness nor edema.  No joint effusions. Neurologic:  Normal speech and language. No gross focal neurologic deficits are appreciated. No gait instability. Skin:  Skin is warm, dry and intact. No rash noted. Psychiatric: Mood and affect are normal. Speech and behavior are normal.  ____________________________________________   LABS (all labs ordered are listed, but only abnormal results are displayed)  Results for orders placed or performed during the hospital encounter of 11/04/16 (from the past 24  hour(s))  Urinalysis, Complete w Microscopic     Status: Abnormal   Collection Time: 11/04/16 11:02 AM  Result Value Ref Range   Color, Urine YELLOW (A) YELLOW   APPearance CLEAR (A) CLEAR   Specific Gravity, Urine 1.012 1.005 - 1.030   pH 5.0 5.0 - 8.0   Glucose, UA NEGATIVE NEGATIVE mg/dL   Hgb urine dipstick SMALL (A) NEGATIVE   Bilirubin Urine NEGATIVE NEGATIVE   Ketones, ur NEGATIVE NEGATIVE mg/dL   Protein, ur 30 (A) NEGATIVE mg/dL   Nitrite NEGATIVE NEGATIVE   Leukocytes, UA NEGATIVE NEGATIVE   RBC / HPF 0-5 0 - 5 RBC/hpf   WBC, UA 0-5 0 - 5 WBC/hpf   Bacteria, UA NONE SEEN NONE SEEN   Squamous Epithelial / LPF 0-5 (A) NONE SEEN   Mucous PRESENT    ____________________________________________ ____________________________________________  RADIOLOGY   ____________________________________________   PROCEDURES  Procedure(s) performed:  Procedures    Critical Care performed: no ____________________________________________   INITIAL IMPRESSION / ASSESSMENT AND PLAN / ED COURSE  Pertinent labs & imaging results that were available during my care of the patient were reviewed by me and considered in my medical decision making (see chart for details).  DDX: bpg, hematuria, urinary retention, prostatitis, uti  Gregory Ramos is a 76 y.o. who presents to the ED with acute urinary retention and suprapubic pain. Patient hypertensive but afebrile and otherwise hemodynamic stable. Patient with nearly 1 L and his bladder. Had significant and complete resolution of pain discomfort after insertion of daily catheter. Urinalysis shows no evidence of infection or blood. I do suspect that this presentation is secondary to the patient running off of his finasteride. He is otherwise clinically stable for close outpatient follow-up. Able to ambulate with a steady gait. Discussed signs and symptoms for which she should return to the ER and he does have established care with a urologist.   Patient was able to tolerate PO and was able to ambulate with a steady gait.  Have discussed with the patient and available family all diagnostics and treatments performed thus far and all questions were answered to the best of my ability. The patient demonstrates understanding and agreement with plan.       ____________________________________________   FINAL CLINICAL IMPRESSION(S) / ED DIAGNOSES  Final diagnoses:  Urinary retention  Suprapubic pain, acute      NEW MEDICATIONS STARTED DURING THIS VISIT:  New Prescriptions   No medications on file     Note:  This document was prepared using Dragon voice recognition software and may include unintentional dictation errors.    Merlyn Lot, MD 11/04/16 [phone]

## 2016-11-04 NOTE — ED Notes (Signed)
Foley bag changed to leg bag, education given on home care, pt verbalized understanding.

## 2016-11-04 NOTE — Discharge Instructions (Signed)
Continue to take the Finasteride and Uroxatral as previously directed. Return for fevers, difficulty urinating, inability to tolerate home medications.  Follow up with Urology.1

## 2016-11-04 NOTE — ED Triage Notes (Signed)
Pt reports has not had normal urination since yesterday. Is on medicine for prostate.  Appears in pain. Has had a few little drops of urine. Denies fevers.

## 2016-11-04 NOTE — ED Notes (Signed)
Bladder scan prior to catheter insertion reveals 999 ml. Urine in bladder.  Immediate return of 900 ml of clear urine upon insertion.

## 2016-11-04 NOTE — ED Notes (Signed)
865mL drained from catheter draining bag.

## 2016-12-04 ENCOUNTER — Other Ambulatory Visit: Payer: Self-pay | Admitting: Urology

## 2016-12-05 ENCOUNTER — Emergency Department
Admission: EM | Admit: 2016-12-05 | Discharge: 2016-12-05 | Disposition: A | Discharge status: Home / Self Care | Payer: Medicare Other | Attending: Student in an Organized Health Care Education/Training Program | Admitting: Student in an Organized Health Care Education/Training Program

## 2016-12-05 ENCOUNTER — Encounter: Payer: Self-pay | Admitting: Emergency Medicine

## 2016-12-05 DIAGNOSIS — Z87891 Personal history of nicotine dependence: Secondary | ICD-10-CM | POA: Diagnosis not present

## 2016-12-05 DIAGNOSIS — Z466 Encounter for fitting and adjustment of urinary device: Secondary | ICD-10-CM | POA: Insufficient documentation

## 2016-12-05 DIAGNOSIS — I10 Essential (primary) hypertension: Secondary | ICD-10-CM | POA: Diagnosis not present

## 2016-12-05 DIAGNOSIS — Z978 Presence of other specified devices: Secondary | ICD-10-CM

## 2016-12-05 DIAGNOSIS — Z79899 Other long term (current) drug therapy: Secondary | ICD-10-CM | POA: Diagnosis not present

## 2016-12-05 DIAGNOSIS — R339 Retention of urine, unspecified: Secondary | ICD-10-CM | POA: Insufficient documentation

## 2016-12-05 DIAGNOSIS — Z960 Presence of urogenital implants: Secondary | ICD-10-CM

## 2016-12-05 LAB — URINALYSIS, COMPLETE (UACMP) WITH MICROSCOPIC
BACTERIA UA: NONE SEEN
Bilirubin Urine: NEGATIVE
Glucose, UA: NEGATIVE mg/dL
Ketones, ur: NEGATIVE mg/dL
Nitrite: NEGATIVE
PROTEIN: 100 mg/dL — AB
SQUAMOUS EPITHELIAL / LPF: NONE SEEN
Specific Gravity, Urine: 1.009 (ref 1.005–1.030)
pH: 6 (ref 5.0–8.0)

## 2016-12-05 LAB — BASIC METABOLIC PANEL
Anion gap: 7 (ref 5–15)
BUN: 35 mg/dL — ABNORMAL HIGH (ref 6–20)
CALCIUM: 9.9 mg/dL (ref 8.9–10.3)
CO2: 27 mmol/L (ref 22–32)
Chloride: 105 mmol/L (ref 101–111)
Creatinine, Ser: 2.06 mg/dL — ABNORMAL HIGH (ref 0.61–1.24)
GFR calc non Af Amer: 30 mL/min — ABNORMAL LOW (ref >60)
GFR, EST AFRICAN AMERICAN: 35 mL/min — AB (ref >60)
Glucose, Bld: 116 mg/dL — ABNORMAL HIGH (ref 65–99)
POTASSIUM: 3.8 mmol/L (ref 3.5–5.1)
Sodium: 139 mmol/L (ref 135–145)

## 2016-12-05 LAB — CBC
HCT: 39 % — ABNORMAL LOW (ref 40.0–52.0)
HEMOGLOBIN: 12.6 g/dL — AB (ref 13.0–18.0)
MCH: 28.5 pg (ref 26.0–34.0)
MCHC: 32.4 g/dL (ref 32.0–36.0)
MCV: 88 fL (ref 80.0–100.0)
Platelets: 238 10*3/uL (ref 150–440)
RBC: 4.44 MIL/uL (ref 4.40–5.90)
RDW: 14.9 % — ABNORMAL HIGH (ref 11.5–14.5)
WBC: 8.6 10*3/uL (ref 3.8–10.6)

## 2016-12-05 MED ORDER — LIDOCAINE HCL 2 % EX GEL
CUTANEOUS | Status: AC
  Filled 2016-12-05: qty 10

## 2016-12-05 MED ORDER — LIDOCAINE HCL 2 % EX GEL
1.0000 | Freq: Once | CUTANEOUS | Status: DC
Start: 2016-12-05 — End: 2016-12-05
  Filled 2016-12-05: qty 5

## 2016-12-05 NOTE — ED Notes (Signed)
3 way catheter placed and irrigating at this time.

## 2016-12-05 NOTE — ED Triage Notes (Signed)
Pt to ed with c/o urinary catheter problems.  Pt states he has not emptied catheter bag since last night and it is still empty.  States he feels like it might have a clot or something blocking it.

## 2016-12-05 NOTE — ED Notes (Signed)
Dr. Quentin Cornwall replaced foley catheter at this time.  Catheter is draining.

## 2016-12-05 NOTE — ED Provider Notes (Signed)
[facility] Emergency Department Provider Note    None    (approximate)  I have reviewed the triage vital signs and the nursing notes.   HISTORY  Chief Complaint Urinary Retention    HPI Gregory Ramos is a 76 y.o. male suprapubic discomfort and decreased urine output of over the last 12 hours. I last saw this patient roughly 1 month ago for acute Urinary Obstruction Pl., Foley catheter. He has since followed up with urology in [place] with plan for outpatient surgery due to enlarged prostate. Was resides started on Keflex for UTI. As noted increasing blood-tinged urine. No flank pain. No fevers. No nausea or vomiting. No chest pain. Candidate because he could not empty his bladder. He is concern for something clog in the Foley catheter.   Past Medical History:  Diagnosis Date  . Hyperlipidemia   . Hypertension   . Prostate enlargement   . Sleep apnea    resolved with weight loss   History reviewed. No pertinent family history. Past Surgical History:  Procedure Laterality Date  . COLONOSCOPY N/A 02/20/2015   Procedure: COLONOSCOPY;  Surgeon: Robert Bellow, MD;  Location: [facility] ENDOSCOPY;  Service: Endoscopy;  Laterality: N/A;  . CYST REMOVAL HAND  50932  . PROSTATE BIOPSY  2000?   Patient Active Problem List   Diagnosis Date Noted  . Encounter for screening colonoscopy 01/18/2015      Prior to Admission medications   Medication Sig Start Date End Date Taking? Authorizing Provider  alfuzosin (UROXATRAL) 10 MG 24 hr tablet Take 10 mg by mouth daily with breakfast.  12/05/14  Yes Historical Provider, MD  ciprofloxacin (CIPRO) 500 MG tablet Take 500 mg by mouth 2 (two) times daily. Take 500 mg by mouth 2 (two) times daily for 14 days. 11/28/16 12/11/16 Yes Historical Provider, MD  finasteride (PROSCAR) 5 MG tablet Take 5 mg by mouth daily.  11/07/14  Yes Historical Provider, MD  lisinopril-hydrochlorothiazide (PRINZIDE,ZESTORETIC) 20-25 MG per  tablet Take 1 tablet by mouth daily.  01/02/15  Yes Historical Provider, MD  metoprolol (LOPRESSOR) 50 MG tablet Take 50 mg by mouth 2 (two) times daily. 11/12/16  Yes Historical Provider, MD    Allergies Patient has no known allergies.    Social History Social History  Substance Use Topics  . Smoking status: Former Smoker    Packs/day: 1.00    Years: 15.00    Types: Cigarettes  . Smokeless tobacco: Never Used  . Alcohol use No    Review of Systems Patient denies headaches, rhinorrhea, blurry vision, numbness, shortness of breath, chest pain, edema, cough, abdominal pain, nausea, vomiting, diarrhea, dysuria, fevers, rashes or hallucinations unless otherwise stated above in HPI. ____________________________________________   PHYSICAL EXAM:  VITAL SIGNS: Vitals:   12/05/16 1257 12/05/16 1504  BP: (!) 177/89 (!) 168/102  Pulse: 79 75  Resp: 18 20  Temp:      Constitutional: Alert and oriented. Well appearing and in no acute distress. Eyes: Conjunctivae are normal. PERRL. EOMI. Head: Atraumatic. Nose: No congestion/rhinnorhea. Mouth/Throat: Mucous membranes are moist.  Oropharynx non-erythematous. Neck: No stridor. Painless ROM. No cervical spine tenderness to palpation Hematological/Lymphatic/Immunilogical: No cervical lymphadenopathy. Cardiovascular: Normal rate, regular rhythm. Grossly normal heart sounds.  Good peripheral circulation. Respiratory: Normal respiratory effort.  No retractions. Lungs CTAB. Gastrointestinal: Soft and nontender. No distention. No abdominal bruits. No CVA tenderness. Genitourinary: foley in place, hematuria noted in foley bagg Musculoskeletal: No lower extremity tenderness nor edema.  No joint effusions.  Neurologic:  Normal speech and language. No gross focal neurologic deficits are appreciated. No gait instability. Skin:  Skin is warm, dry and intact. No rash noted. Psychiatric: Mood and affect are normal. Speech and behavior are  normal. ____________________________________________   LABS (all labs ordered are listed, but only abnormal results are displayed)  Results for orders placed or performed during the hospital encounter of 12/05/16 (from the past 24 hour(s))  Urinalysis, Complete w Microscopic     Status: Abnormal   Collection Time: 12/05/16 10:10 AM  Result Value Ref Range   Color, Urine RED (A) YELLOW   APPearance CLOUDY (A) CLEAR   Specific Gravity, Urine 1.009 1.005 - 1.030   pH 6.0 5.0 - 8.0   Glucose, UA NEGATIVE NEGATIVE mg/dL   Hgb urine dipstick LARGE (A) NEGATIVE   Bilirubin Urine NEGATIVE NEGATIVE   Ketones, ur NEGATIVE NEGATIVE mg/dL   Protein, ur 100 (A) NEGATIVE mg/dL   Nitrite NEGATIVE NEGATIVE   Leukocytes, UA TRACE (A) NEGATIVE   RBC / HPF TOO NUMEROUS TO COUNT 0 - 5 RBC/hpf   WBC, UA TOO NUMEROUS TO COUNT 0 - 5 WBC/hpf   Bacteria, UA NONE SEEN NONE SEEN   Squamous Epithelial / LPF NONE SEEN NONE SEEN   Mucous PRESENT   CBC     Status: Abnormal   Collection Time: 12/05/16 10:10 AM  Result Value Ref Range   WBC 8.6 3.8 - 10.6 K/uL   RBC 4.44 4.40 - 5.90 MIL/uL   Hemoglobin 12.6 (L) 13.0 - 18.0 g/dL   HCT 39.0 (L) 40.0 - 52.0 %   MCV 88.0 80.0 - 100.0 fL   MCH 28.5 26.0 - 34.0 pg   MCHC 32.4 32.0 - 36.0 g/dL   RDW 14.9 (H) 11.5 - 14.5 %   Platelets 238 150 - 440 K/uL  Basic metabolic panel     Status: Abnormal   Collection Time: 12/05/16 10:10 AM  Result Value Ref Range   Sodium 139 135 - 145 mmol/L   Potassium 3.8 3.5 - 5.1 mmol/L   Chloride 105 101 - 111 mmol/L   CO2 27 22 - 32 mmol/L   Glucose, Bld 116 (H) 65 - 99 mg/dL   BUN 35 (H) 6 - 20 mg/dL   Creatinine, Ser 2.06 (H) 0.61 - 1.24 mg/dL   Calcium 9.9 8.9 - 10.3 mg/dL   GFR calc non Af Amer 30 (L) >60 mL/min   GFR calc Af Amer 35 (L) >60 mL/min   Anion gap 7 5 - 15   ____________________________________________  ____________________________________________  RADIOLOGY  Bedside US with 800cc in bladder. Area  of what appears to be clot in the dependent portion of the bladder ____________________________________________   PROCEDURES  Procedure(s) performed:  Procedures    Critical Care performed: no ____________________________________________   INITIAL IMPRESSION / ASSESSMENT AND PLAN / ED COURSE  Pertinent labs & imaging results that were available during my care of the patient were reviewed by me and considered in my medical decision making (see chart for details).  DDX: foley catheter occlusion, uti, hematuria, clot  AMANI NODARSE is a 76 y.o. who presents to the ED with suprapubic pain in the setting of an indwelling Foley catheter that appears clogged.  Otherwise he will dynamically stable. We'll plan stage Foley. Likely secondary to blood clots and will likely require bladder irrigation.  The patient will be placed on continuous pulse oximetry and telemetry for monitoring.  Laboratory evaluation will be sent to evaluate  for the above complaints.     Clinical Course as of Dec 06 1619  Fri Dec 05, 2016  0903 10fr foleyinserted to decompress bladder with significant improvement in symptoms.  Dark heamturia noted with clots.  WIll place threeway for CBI   [PR]  1049 Renal function at baseline.  [PR]  3710 Will repeat baldder irrigation.  Urine is clearing but still with faint clots noted.  Patient remains stable and in NAD.  [PR]  6269 Urine now much clearer.  Will complete irrigation and replace foley  [PR]  22 Spoke with Dr. Alyson Ingles of urology regarding abx.  Recommends continuing cipro as prescribed and follow up in clinic.  Have discussed with the patient and available family all diagnostics and treatments performed thus far and all questions were answered to the best of my ability. The patient demonstrates understanding and agreement with plan.   [PR]  4854 Foley exchanged without complication.  Good urinary flow.  Patient hemodynamically stable.  Have discussed with the  patient and available family all diagnostics and treatments performed thus far and all questions were answered to the best of my ability. The patient demonstrates understanding and agreement with plan.   [PR]    Clinical Course User Index [PR] Merlyn Lot, MD     ____________________________________________   FINAL CLINICAL IMPRESSION(S) / ED DIAGNOSES  Final diagnoses:  Urinary retention  Foley catheter in place      NEW MEDICATIONS STARTED DURING THIS VISIT:  Discharge Medication List as of 12/05/2016  2:28 PM       Note:  This document was prepared using Dragon voice recognition software and may include unintentional dictation errors.    Merlyn Lot, MD 12/05/16 [phone]

## 2016-12-05 NOTE — Discharge Instructions (Signed)
Please follow up with Dr. Tresa Moore.  Return for fevers, worsening pain, clogged foley catheter.

## 2016-12-06 ENCOUNTER — Emergency Department
Admission: EM | Admit: 2016-12-06 | Discharge: 2016-12-06 | Disposition: A | Discharge status: Home / Self Care | Payer: Medicare Other | Attending: Emergency Medicine | Admitting: Emergency Medicine

## 2016-12-06 ENCOUNTER — Encounter: Payer: Self-pay | Admitting: Emergency Medicine

## 2016-12-06 DIAGNOSIS — Z87891 Personal history of nicotine dependence: Secondary | ICD-10-CM | POA: Diagnosis not present

## 2016-12-06 DIAGNOSIS — R339 Retention of urine, unspecified: Secondary | ICD-10-CM | POA: Diagnosis present

## 2016-12-06 DIAGNOSIS — Z79899 Other long term (current) drug therapy: Secondary | ICD-10-CM | POA: Insufficient documentation

## 2016-12-06 DIAGNOSIS — I10 Essential (primary) hypertension: Secondary | ICD-10-CM | POA: Diagnosis not present

## 2016-12-06 LAB — URINALYSIS, COMPLETE (UACMP) WITH MICROSCOPIC
BILIRUBIN URINE: NEGATIVE
Bacteria, UA: NONE SEEN
GLUCOSE, UA: NEGATIVE mg/dL
Ketones, ur: NEGATIVE mg/dL
Leukocytes, UA: NEGATIVE
NITRITE: NEGATIVE
PROTEIN: 100 mg/dL — AB
SPECIFIC GRAVITY, URINE: 1.01 (ref 1.005–1.030)
pH: 6 (ref 5.0–8.0)

## 2016-12-06 NOTE — ED Provider Notes (Signed)
[facility] Emergency Department Provider Note  Time seen: 9:17 AM  I have reviewed the triage vital signs and the nursing notes.   HISTORY  Chief Complaint Urinary Retention    HPI Gregory Ramos is a 76 y.o. male with a past medical history of hypertension, hyperlipidemia, BPH, presents the emergency department for lower abdominal pain. Wearing to the patient for the past 3 weeks she has had a Foley catheter inserted, this was exchanged yesterday. Patient states since yesterday he has been having some bleeding in his urine as well. Since last night the patient has not had any drainage from the catheter presents today with urinary retention and abdominal fullness and lower abdominal pain. States nausea upon arrival, denies any vomiting or fever. Patient is currently taking ciprofloxacin per patient.Foley catheter was flushed upon arrival with significant relief, urine is now flowing freely. Upon my examination the patient denies any pain at this time.  Past Medical History:  Diagnosis Date  . Hyperlipidemia   . Hypertension   . Prostate enlargement   . Sleep apnea    resolved with weight loss    Patient Active Problem List   Diagnosis Date Noted  . Encounter for screening colonoscopy 01/18/2015    Past Surgical History:  Procedure Laterality Date  . COLONOSCOPY N/A 02/20/2015   Procedure: COLONOSCOPY;  Surgeon: Robert Bellow, MD;  Location: [facility] ENDOSCOPY;  Service: Endoscopy;  Laterality: N/A;  . CYST REMOVAL HAND  23300  . PROSTATE BIOPSY  2000?    Prior to Admission medications   Medication Sig Start Date End Date Taking? Authorizing Provider  alfuzosin (UROXATRAL) 10 MG 24 hr tablet Take 10 mg by mouth daily with breakfast.  12/05/14   Historical Provider, MD  ciprofloxacin (CIPRO) 500 MG tablet Take 500 mg by mouth 2 (two) times daily. Take 500 mg by mouth 2 (two) times daily for 14 days. 11/28/16 12/11/16  Historical Provider, MD  finasteride  (PROSCAR) 5 MG tablet Take 5 mg by mouth daily.  11/07/14   Historical Provider, MD  lisinopril-hydrochlorothiazide (PRINZIDE,ZESTORETIC) 20-25 MG per tablet Take 1 tablet by mouth daily.  01/02/15   Historical Provider, MD  metoprolol (LOPRESSOR) 50 MG tablet Take 50 mg by mouth 2 (two) times daily. 11/12/16   Historical Provider, MD    No Known Allergies  History reviewed. No pertinent family history.  Social History Social History  Substance Use Topics  . Smoking status: Former Smoker    Packs/day: 1.00    Years: 15.00    Types: Cigarettes  . Smokeless tobacco: Never Used  . Alcohol use No    Review of Systems Constitutional: Negative for fever. Cardiovascular: Negative for chest pain. Respiratory: Negative for shortness of breath. Gastrointestinal: Lower abdominal pain. Positive for nausea, now resolved. Negative for vomiting or diarrhea. Genitourinary: Positive for hematuria. Positive for urinary retention. Neurological: Negative for headache 10-point ROS otherwise negative.  ____________________________________________   PHYSICAL EXAM:  VITAL SIGNS: ED Triage Vitals  Enc Vitals Group     BP 12/06/16 0833 (!) 142/93     Pulse Rate 12/06/16 0833 85     Resp 12/06/16 0833 15     Temp 12/06/16 0833 98 F (36.7 C)     Temp Source 12/06/16 0833 Oral     SpO2 12/06/16 0833 97 %     Weight 12/06/16 0834 245 lb (111.1 kg)     Height 12/06/16 0834 5\' 11"  (1.803 m)     Head Circumference --  Peak Flow --      Pain Score 12/06/16 0836 10     Pain Loc --      Pain Edu? --      Excl. in [place]? --     Constitutional: Alert and oriented. Well appearing and in no distress. Eyes: Normal exam ENT   Head: Normocephalic and atraumatic.   Mouth/Throat: Mucous membranes are moist. Cardiovascular: Normal rate, regular rhythm. No murmur Respiratory: Normal respiratory effort without tachypnea nor retractions. Breath sounds are clear  Gastrointestinal: Soft, nontender, no  distention. (After Foley catheter flushed). Musculoskeletal: Nontender with normal range of motion in all extremities. Neurologic:  Normal speech and language. No gross focal neurologic deficits  Skin:  Skin is warm, dry and intact.  Psychiatric: Mood and affect are normal.   ____________________________________________   INITIAL IMPRESSION / ASSESSMENT AND PLAN / ED COURSE  Pertinent labs & imaging results that were available during my care of the patient were reviewed by me and considered in my medical decision making (see chart for details).  Patient presents to the emergency department with urinary retention likely due to blood clot. Foley catheter was flushed with good return of urine, now flowing freely. Patient states complete resolution of discomfort. We will send a urinalysis. Patient is currently on prophylactic ciprofloxacin. Patient has follow-up with urology already scheduled.  Urinalysis was consistent with hematuria, no obvious UTI. Urine culture has been sent. Patient is currently on ciprofloxacin. Continues to feel well with no discomfort after Foley catheter flushed. We will discharge the patient with urology follow-up.  ____________________________________________   FINAL CLINICAL IMPRESSION(S) / ED DIAGNOSES  Acute urinary retention    Harvest Dark, MD 12/06/16 1044

## 2016-12-06 NOTE — Discharge Instructions (Signed)
Please follow-up with your urologist as scheduled. Return to the emergency department for any abdominal pain, if your Foley catheter fails to drain urine for greater than 12 hours, any fever, or any other symptom personally concerning to yourself.

## 2016-12-06 NOTE — ED Triage Notes (Signed)
Pt to ed with c/o catheter problems.  Pt states he was seen here yesterday for urinary retention and catheter was placed, however last night at about 9pm, states it stopped draining.

## 2016-12-06 NOTE — ED Notes (Signed)
Catheter irrigated. Is now draining. Pt reports he feels much better. Bloody drainage, will continue to monitor.

## 2016-12-06 NOTE — ED Notes (Signed)
Leg bag emptied. Foley draining well.

## 2016-12-07 LAB — URINE CULTURE
CULTURE: NO GROWTH
Culture: NO GROWTH

## 2017-01-06 NOTE — Patient Instructions (Addendum)
Gregory Ramos  01/06/2017   Your procedure is scheduled on: 01-14-17  Report to [facility] Main  Entrance Take [employer]  elevators to 3rd floor to  [place] at [phone].   Call this number if you have problems the morning of surgery [phone]    Remember: ONLY 1 PERSON MAY GO WITH YOU TO SHORT STAY TO GET  READY MORNING OF YOUR SURGERY.  Do not eat food After Midnight Monday night 01-12-17. Drink plenty of clear liquids all day Tuesday 01-13-17 and follow all of Dr [facility] bowel prep instructions. Nothing after midnight Tuesday night !!     Take these medicines the morning of surgery with A SIP OF WATER: metoprolol                                You may not have any metal on your body including hair pins and              piercings  Do not wear jewelry, make-up, lotions, powders or perfumes, deodorant               Men may shave face and neck.   Do not bring valuables to the hospital. [place].  Contacts, dentures or bridgework may not be worn into surgery.  Leave suitcase in the car. After surgery it may be brought to your room.               Please read over the following fact sheets you were given: _____________________________________________________________________                CLEAR LIQUID DIET   Foods Allowed                                                                     Foods Excluded  Coffee and tea, regular and decaf                             liquids that you cannot  Plain Jell-O in any flavor                                             see through such as: Fruit ices (not with fruit pulp)                                     milk, soups, orange juice  Iced Popsicles                                    All solid food Carbonated beverages, regular and diet  Cranberry, grape and apple juices Sports drinks like Gatorade Lightly seasoned clear  broth or consume(fat free) Sugar, honey syrup  Sample Menu Breakfast                                Lunch                                     Supper Cranberry juice                    Beef broth                            Chicken broth Jell-O                                     Grape juice                           Apple juice Coffee or tea                        Jell-O                                      Popsicle                                                Coffee or tea                        Coffee or tea  _____________________________________________________________________  [facility] - Preparing for Surgery Before surgery, you can play an important role.  Because skin is not sterile, your skin needs to be as free of germs as possible.  You can reduce the number of germs on your skin by washing with CHG (chlorahexidine gluconate) soap before surgery.  CHG is an antiseptic cleaner which kills germs and bonds with the skin to continue killing germs even after washing. Please DO NOT use if you have an allergy to CHG or antibacterial soaps.  If your skin becomes reddened/irritated stop using the CHG and inform your nurse when you arrive at Short Stay. Do not shave (including legs and underarms) for at least 48 hours prior to the first CHG shower.  You may shave your face/neck. Please follow these instructions carefully:  1.  Shower with CHG Soap the night before surgery and the  morning of Surgery.  2.  If you choose to wash your hair, wash your hair first as usual with your  normal  shampoo.  3.  After you shampoo, rinse your hair and body thoroughly to remove the  shampoo.                           4.  Use CHG as you would any other liquid soap.  You can apply chg directly  to the skin and wash  Gently with a scrungie or clean washcloth.  5.  Apply the CHG Soap to your body ONLY FROM THE NECK DOWN.   Do not use on face/ open                           Wound or open  sores. Avoid contact with eyes, ears mouth and genitals (private parts).                       Wash face,  Genitals (private parts) with your normal soap.             6.  Wash thoroughly, paying special attention to the area where your surgery  will be performed.  7.  Thoroughly rinse your body with warm water from the neck down.  8.  DO NOT shower/wash with your normal soap after using and rinsing off  the CHG Soap.                9.  Pat yourself dry with a clean towel.            10.  Wear clean pajamas.            11.  Place clean sheets on your bed the night of your first shower and do not  sleep with pets. Day of Surgery : Do not apply any lotions/deodorants the morning of surgery.  Please wear clean clothes to the hospital/surgery center.  FAILURE TO FOLLOW THESE INSTRUCTIONS MAY RESULT IN THE CANCELLATION OF YOUR SURGERY PATIENT SIGNATURE_________________________________  NURSE SIGNATURE__________________________________  ________________________________________________________________________

## 2017-01-08 ENCOUNTER — Encounter (HOSPITAL_COMMUNITY)
Admission: RE | Admit: 2017-01-08 | Discharge: 2017-01-08 | Disposition: A | Discharge status: Home / Self Care | Payer: Medicare Other | Source: Ambulatory Visit | Attending: Urology | Admitting: Urology

## 2017-01-08 ENCOUNTER — Encounter (HOSPITAL_COMMUNITY): Payer: Self-pay

## 2017-01-08 ENCOUNTER — Encounter (INDEPENDENT_AMBULATORY_CARE_PROVIDER_SITE_OTHER): Payer: Self-pay

## 2017-01-08 DIAGNOSIS — Z01812 Encounter for preprocedural laboratory examination: Secondary | ICD-10-CM | POA: Insufficient documentation

## 2017-01-08 DIAGNOSIS — I10 Essential (primary) hypertension: Secondary | ICD-10-CM | POA: Diagnosis not present

## 2017-01-08 DIAGNOSIS — Z01810 Encounter for preprocedural cardiovascular examination: Secondary | ICD-10-CM | POA: Diagnosis present

## 2017-01-08 LAB — CBC
HCT: 40.3 % (ref 39.0–52.0)
HEMOGLOBIN: 12.9 g/dL — AB (ref 13.0–17.0)
MCH: 28.8 pg (ref 26.0–34.0)
MCHC: 32 g/dL (ref 30.0–36.0)
MCV: 90 fL (ref 78.0–100.0)
Platelets: 172 10*3/uL (ref 150–400)
RBC: 4.48 MIL/uL (ref 4.22–5.81)
RDW: 14.8 % (ref 11.5–15.5)
WBC: 5.2 10*3/uL (ref 4.0–10.5)

## 2017-01-08 LAB — BASIC METABOLIC PANEL
ANION GAP: 5 (ref 5–15)
BUN: 28 mg/dL — ABNORMAL HIGH (ref 6–20)
CHLORIDE: 106 mmol/L (ref 101–111)
CO2: 30 mmol/L (ref 22–32)
Calcium: 10 mg/dL (ref 8.9–10.3)
Creatinine, Ser: 1.9 mg/dL — ABNORMAL HIGH (ref 0.61–1.24)
GFR calc Af Amer: 38 mL/min — ABNORMAL LOW (ref >60)
GFR calc non Af Amer: 33 mL/min — ABNORMAL LOW (ref >60)
GLUCOSE: 99 mg/dL (ref 65–99)
Potassium: 4.3 mmol/L (ref 3.5–5.1)
Sodium: 141 mmol/L (ref 135–145)

## 2017-01-08 LAB — ABO/RH: ABO/RH(D): O POS

## 2017-01-08 NOTE — Progress Notes (Signed)
EKG done today and previous EKG along with ECHO report, although dated 2014, shown to Dr Jillyn Hidden MD anesthesia. PMH,  BP meds,  and review of surgery provided. Dr Jillyn Hidden reviewed and is okaying patient for surgery. No further instructions provided.

## 2017-01-13 MED ORDER — GENTAMICIN SULFATE 40 MG/ML IJ SOLN
5.0000 mg/kg | INTRAVENOUS | Status: AC
  Administered 2017-01-14: 450 mg via INTRAVENOUS
  Filled 2017-01-13 (×2): qty 11.25

## 2017-01-14 ENCOUNTER — Inpatient Hospital Stay (HOSPITAL_COMMUNITY)
Admission: RE | Admit: 2017-01-14 | Discharge: 2017-01-15 | DRG: 707 | Disposition: A | Discharge status: Home / Self Care | Payer: Medicare Other | Source: Ambulatory Visit | Attending: Urology | Admitting: Urology

## 2017-01-14 ENCOUNTER — Inpatient Hospital Stay (HOSPITAL_COMMUNITY): Payer: Medicare Other | Admitting: Anesthesiology

## 2017-01-14 ENCOUNTER — Encounter (HOSPITAL_COMMUNITY): Payer: Self-pay | Admitting: *Deleted

## 2017-01-14 ENCOUNTER — Encounter (HOSPITAL_COMMUNITY)
Admission: RE | Disposition: A | Discharge status: Home / Self Care | Payer: Self-pay | Source: Ambulatory Visit | Attending: Urology

## 2017-01-14 DIAGNOSIS — N138 Other obstructive and reflux uropathy: Secondary | ICD-10-CM | POA: Diagnosis not present

## 2017-01-14 DIAGNOSIS — E785 Hyperlipidemia, unspecified: Secondary | ICD-10-CM | POA: Diagnosis not present

## 2017-01-14 DIAGNOSIS — N401 Enlarged prostate with lower urinary tract symptoms: Principal | ICD-10-CM | POA: Diagnosis present

## 2017-01-14 DIAGNOSIS — G4730 Sleep apnea, unspecified: Secondary | ICD-10-CM | POA: Diagnosis present

## 2017-01-14 DIAGNOSIS — I10 Essential (primary) hypertension: Secondary | ICD-10-CM | POA: Diagnosis present

## 2017-01-14 DIAGNOSIS — Z87891 Personal history of nicotine dependence: Secondary | ICD-10-CM | POA: Diagnosis not present

## 2017-01-14 HISTORY — PX: XI ROBOTIC ASSISTED SIMPLE PROSTATECTOMY: SHX6713

## 2017-01-14 LAB — TYPE AND SCREEN
ABO/RH(D): O POS
Antibody Screen: NEGATIVE

## 2017-01-14 LAB — HEMOGLOBIN AND HEMATOCRIT, BLOOD
HCT: 41.3 % (ref 39.0–52.0)
Hemoglobin: 13.2 g/dL (ref 13.0–17.0)

## 2017-01-14 SURGERY — PROSTATECTOMY, SIMPLE, ROBOT-ASSISTED
Anesthesia: General | Site: Abdomen

## 2017-01-14 MED ORDER — LISINOPRIL-HYDROCHLOROTHIAZIDE 20-25 MG PO TABS
1.0000 | ORAL_TABLET | Freq: Every day | ORAL | Status: DC
Start: 2017-01-15 — End: 2017-01-14

## 2017-01-14 MED ORDER — ONDANSETRON HCL 4 MG/2ML IJ SOLN
INTRAMUSCULAR | Status: AC
  Filled 2017-01-14: qty 2

## 2017-01-14 MED ORDER — BUPIVACAINE LIPOSOME 1.3 % IJ SUSP
20.0000 mL | Freq: Once | INTRAMUSCULAR | Status: DC
  Filled 2017-01-14: qty 20

## 2017-01-14 MED ORDER — EPHEDRINE 5 MG/ML INJ
INTRAVENOUS | Status: AC
  Filled 2017-01-14: qty 10

## 2017-01-14 MED ORDER — SUGAMMADEX SODIUM 500 MG/5ML IV SOLN
INTRAVENOUS | Status: AC
  Filled 2017-01-14: qty 5

## 2017-01-14 MED ORDER — PROPOFOL 10 MG/ML IV BOLUS
INTRAVENOUS | Status: DC | PRN
  Administered 2017-01-14: 150 mg via INTRAVENOUS

## 2017-01-14 MED ORDER — FENTANYL CITRATE (PF) 100 MCG/2ML IJ SOLN
INTRAMUSCULAR | Status: AC
  Filled 2017-01-14: qty 2

## 2017-01-14 MED ORDER — DIPHENHYDRAMINE HCL 50 MG/ML IJ SOLN: 12.5000 mg | Freq: Four times a day (QID) | INTRAMUSCULAR | Status: DC | PRN

## 2017-01-14 MED ORDER — DIPHENHYDRAMINE HCL 12.5 MG/5ML PO ELIX: 12.5000 mg | ORAL_SOLUTION | Freq: Four times a day (QID) | ORAL | Status: DC | PRN

## 2017-01-14 MED ORDER — ROCURONIUM BROMIDE 50 MG/5ML IV SOSY
PREFILLED_SYRINGE | INTRAVENOUS | Status: DC | PRN
  Administered 2017-01-14 (×3): 10 mg via INTRAVENOUS
  Administered 2017-01-14: 50 mg via INTRAVENOUS
  Administered 2017-01-14: 20 mg via INTRAVENOUS

## 2017-01-14 MED ORDER — LIDOCAINE 2% (20 MG/ML) 5 ML SYRINGE
INTRAMUSCULAR | Status: DC | PRN
  Administered 2017-01-14: 100 mg via INTRAVENOUS

## 2017-01-14 MED ORDER — FENTANYL CITRATE (PF) 250 MCG/5ML IJ SOLN
INTRAMUSCULAR | Status: DC | PRN
  Administered 2017-01-14: 100 ug via INTRAVENOUS
  Administered 2017-01-14 (×2): 50 ug via INTRAVENOUS

## 2017-01-14 MED ORDER — PROPOFOL 10 MG/ML IV BOLUS
INTRAVENOUS | Status: AC
  Filled 2017-01-14: qty 20

## 2017-01-14 MED ORDER — HYDRALAZINE HCL 20 MG/ML IJ SOLN
INTRAMUSCULAR | Status: AC
  Filled 2017-01-14: qty 1

## 2017-01-14 MED ORDER — CIPROFLOXACIN HCL 500 MG PO TABS: 500.0000 mg | ORAL_TABLET | Freq: Two times a day (BID) | ORAL | 0 refills | Status: DC

## 2017-01-14 MED ORDER — ONDANSETRON HCL 4 MG/2ML IJ SOLN
INTRAMUSCULAR | Status: DC | PRN
  Administered 2017-01-14: 4 mg via INTRAVENOUS

## 2017-01-14 MED ORDER — LACTATED RINGERS IV SOLN
INTRAVENOUS | Status: DC
  Administered 2017-01-14: 14:00:00 via INTRAVENOUS
  Administered 2017-01-14: 1000 mL via INTRAVENOUS

## 2017-01-14 MED ORDER — HYDROMORPHONE HCL 1 MG/ML IJ SOLN
0.5000 mg | INTRAMUSCULAR | Status: DC | PRN
  Administered 2017-01-14: 1 mg via INTRAVENOUS
  Filled 2017-01-14 (×2): qty 1

## 2017-01-14 MED ORDER — ACETAMINOPHEN 500 MG PO TABS
1000.0000 mg | ORAL_TABLET | Freq: Four times a day (QID) | ORAL | Status: AC
  Administered 2017-01-14 – 2017-01-15 (×4): 1000 mg via ORAL
  Filled 2017-01-14 (×4): qty 2

## 2017-01-14 MED ORDER — ROCURONIUM BROMIDE 50 MG/5ML IV SOSY
PREFILLED_SYRINGE | INTRAVENOUS | Status: AC
  Filled 2017-01-14: qty 5

## 2017-01-14 MED ORDER — DEXTROSE-NACL 5-0.45 % IV SOLN
INTRAVENOUS | Status: DC
  Administered 2017-01-14: 20:00:00 via INTRAVENOUS

## 2017-01-14 MED ORDER — DEXAMETHASONE SODIUM PHOSPHATE 10 MG/ML IJ SOLN
INTRAMUSCULAR | Status: AC
  Filled 2017-01-14: qty 1

## 2017-01-14 MED ORDER — SUGAMMADEX SODIUM 200 MG/2ML IV SOLN
INTRAVENOUS | Status: DC | PRN
  Administered 2017-01-14 (×2): 100 mg via INTRAVENOUS

## 2017-01-14 MED ORDER — LIDOCAINE 2% (20 MG/ML) 5 ML SYRINGE
INTRAMUSCULAR | Status: AC
  Filled 2017-01-14: qty 5

## 2017-01-14 MED ORDER — SUGAMMADEX SODIUM 200 MG/2ML IV SOLN
INTRAVENOUS | Status: AC
  Filled 2017-01-14: qty 2

## 2017-01-14 MED ORDER — FENTANYL CITRATE (PF) 100 MCG/2ML IJ SOLN
25.0000 ug | INTRAMUSCULAR | Status: AC | PRN
  Administered 2017-01-14 (×6): 25 ug via INTRAVENOUS

## 2017-01-14 MED ORDER — SUCCINYLCHOLINE CHLORIDE 200 MG/10ML IV SOSY
PREFILLED_SYRINGE | INTRAVENOUS | Status: DC | PRN
  Administered 2017-01-14: 120 mg via INTRAVENOUS

## 2017-01-14 MED ORDER — ONDANSETRON HCL 4 MG/2ML IJ SOLN
4.0000 mg | INTRAMUSCULAR | Status: DC | PRN
Start: 2017-01-14 — End: 2017-01-15

## 2017-01-14 MED ORDER — SODIUM CHLORIDE 0.9 % IJ SOLN
INTRAMUSCULAR | Status: AC
  Filled 2017-01-14: qty 50

## 2017-01-14 MED ORDER — DEXAMETHASONE SODIUM PHOSPHATE 10 MG/ML IJ SOLN
INTRAMUSCULAR | Status: DC | PRN
  Administered 2017-01-14: 10 mg via INTRAVENOUS

## 2017-01-14 MED ORDER — PNEUMOCOCCAL VAC POLYVALENT 25 MCG/0.5ML IJ INJ
0.5000 mL | INJECTION | INTRAMUSCULAR | Status: AC
  Administered 2017-01-15: 0.5 mL via INTRAMUSCULAR
  Filled 2017-01-14: qty 0.5

## 2017-01-14 MED ORDER — SUCCINYLCHOLINE CHLORIDE 200 MG/10ML IV SOSY
PREFILLED_SYRINGE | INTRAVENOUS | Status: AC
  Filled 2017-01-14: qty 10

## 2017-01-14 MED ORDER — LISINOPRIL 20 MG PO TABS
20.0000 mg | ORAL_TABLET | ORAL | Status: DC
  Administered 2017-01-15: 20 mg via ORAL
  Filled 2017-01-14: qty 1

## 2017-01-14 MED ORDER — HYDROCODONE-ACETAMINOPHEN 5-325 MG PO TABS
1.0000 | ORAL_TABLET | Freq: Four times a day (QID) | ORAL | 0 refills | Status: DC | PRN
Start: 2017-01-14 — End: 2021-01-25

## 2017-01-14 MED ORDER — SODIUM CHLORIDE 0.9 % IV BOLUS (SEPSIS)
1000.0000 mL | Freq: Once | INTRAVENOUS | Status: AC
  Administered 2017-01-14: 1000 mL via INTRAVENOUS

## 2017-01-14 MED ORDER — HYDROCHLOROTHIAZIDE 25 MG PO TABS
25.0000 mg | ORAL_TABLET | ORAL | Status: DC
  Administered 2017-01-15: 25 mg via ORAL
  Filled 2017-01-14: qty 1

## 2017-01-14 MED ORDER — OXYCODONE HCL 5 MG PO TABS
5.0000 mg | ORAL_TABLET | ORAL | Status: DC | PRN
  Administered 2017-01-15: 5 mg via ORAL
  Filled 2017-01-14: qty 1

## 2017-01-14 MED ORDER — CEFAZOLIN SODIUM-DEXTROSE 2-4 GM/100ML-% IV SOLN
2.0000 g | INTRAVENOUS | Status: AC
  Administered 2017-01-14: 2 g via INTRAVENOUS
  Filled 2017-01-14: qty 100

## 2017-01-14 SURGICAL SUPPLY — 60 items
APPLICATOR COTTON TIP 6IN STRL (MISCELLANEOUS) ×3 IMPLANT
CATH FOLEY 2WAY SLVR 18FR 30CC (CATHETERS) ×3 IMPLANT
CATH FOLEY 3WAY 30CC 22FR (CATHETERS) ×3 IMPLANT
CATH TIEMANN FOLEY 18FR 5CC (CATHETERS) IMPLANT
CHLORAPREP W/TINT 26ML (MISCELLANEOUS) ×3 IMPLANT
CLIP LIGATING HEM O LOK PURPLE (MISCELLANEOUS) ×6 IMPLANT
CLOTH BEACON ORANGE TIMEOUT ST (SAFETY) ×3 IMPLANT
COVER SURGICAL LIGHT HANDLE (MISCELLANEOUS) ×3 IMPLANT
COVER TIP SHEARS 8 DVNC (MISCELLANEOUS) ×1 IMPLANT
COVER TIP SHEARS 8MM DA VINCI (MISCELLANEOUS) ×2
CUTTER ECHEON FLEX ENDO 45 340 (ENDOMECHANICALS) ×3 IMPLANT
DECANTER SPIKE VIAL GLASS SM (MISCELLANEOUS) ×3 IMPLANT
DERMABOND ADVANCED (GAUZE/BANDAGES/DRESSINGS) ×2
DERMABOND ADVANCED .7 DNX12 (GAUZE/BANDAGES/DRESSINGS) ×1 IMPLANT
DRAPE ARM DVNC X/XI (DISPOSABLE) ×4 IMPLANT
DRAPE COLUMN DVNC XI (DISPOSABLE) ×1 IMPLANT
DRAPE DA VINCI XI ARM (DISPOSABLE) ×8
DRAPE DA VINCI XI COLUMN (DISPOSABLE) ×2
DRSG TEGADERM 4X4.75 (GAUZE/BANDAGES/DRESSINGS) ×3 IMPLANT
ELECT REM PT RETURN 15FT ADLT (MISCELLANEOUS) ×3 IMPLANT
GAUZE SPONGE 2X2 8PLY STRL LF (GAUZE/BANDAGES/DRESSINGS) ×1 IMPLANT
GLOVE BIO SURGEON STRL SZ 6.5 (GLOVE) ×2 IMPLANT
GLOVE BIO SURGEONS STRL SZ 6.5 (GLOVE) ×1
GLOVE BIOGEL M STRL SZ7.5 (GLOVE) ×3 IMPLANT
GLOVE BIOGEL PI IND STRL 7.5 (GLOVE) ×1 IMPLANT
GLOVE BIOGEL PI INDICATOR 7.5 (GLOVE) ×2
GOWN STRL REUS W/TWL LRG LVL3 (GOWN DISPOSABLE) ×9 IMPLANT
HOLDER FOLEY CATH W/STRAP (MISCELLANEOUS) ×3 IMPLANT
IRRIG SUCT STRYKERFLOW 2 WTIP (MISCELLANEOUS) ×3
IRRIGATION SUCT STRKRFLW 2 WTP (MISCELLANEOUS) ×1 IMPLANT
NEEDLE INSUFFLATION 14GA 120MM (NEEDLE) ×3 IMPLANT
PACK ROBOT UROLOGY CUSTOM (CUSTOM PROCEDURE TRAY) ×3 IMPLANT
PAD POSITIONING PINK XL (MISCELLANEOUS) ×3 IMPLANT
PORT ACCESS TROCAR AIRSEAL 12 (TROCAR) ×1 IMPLANT
PORT ACCESS TROCAR AIRSEAL 5M (TROCAR) ×2
RELOAD 45 VASCULAR/THIN (ENDOMECHANICALS) IMPLANT
RELOAD GREEN ECHELON 45 (STAPLE) ×3 IMPLANT
RELOAD WH ECHELON 45 (STAPLE) IMPLANT
SEAL CANN UNIV 5-8 DVNC XI (MISCELLANEOUS) ×4 IMPLANT
SEAL XI 5MM-8MM UNIVERSAL (MISCELLANEOUS) ×8
SET TRI-LUMEN FLTR TB AIRSEAL (TUBING) ×3 IMPLANT
SOLUTION ELECTROLUBE (MISCELLANEOUS) ×3 IMPLANT
SPONGE GAUZE 2X2 STER 10/PKG (GAUZE/BANDAGES/DRESSINGS) ×2
SPONGE LAP 4X18 X RAY DECT (DISPOSABLE) ×3 IMPLANT
SUT ETHILON 3 0 PS 1 (SUTURE) ×3 IMPLANT
SUT MNCRL AB 4-0 PS2 18 (SUTURE) ×6 IMPLANT
SUT PDS AB 1 CT1 27 (SUTURE) ×6 IMPLANT
SUT V-LOC BARB 180 2/0GR6 GS22 (SUTURE) ×3
SUT VIC AB 0 CT1 27 (SUTURE) ×8
SUT VIC AB 0 CT1 27XBRD ANTBC (SUTURE) ×4 IMPLANT
SUT VIC AB 2-0 SH 27 (SUTURE) ×2
SUT VIC AB 2-0 SH 27X BRD (SUTURE) ×1 IMPLANT
SUT VICRYL 0 UR6 27IN ABS (SUTURE) ×3 IMPLANT
SUT VLOC BARB 180 ABS3/0GR12 (SUTURE) ×6
SUTURE V-LC BRB 180 2/0GR6GS22 (SUTURE) ×1 IMPLANT
SUTURE VLOC BRB 180 ABS3/0GR12 (SUTURE) ×2 IMPLANT
TOWEL OR 17X26 10 PK STRL BLUE (TOWEL DISPOSABLE) ×3 IMPLANT
TOWEL OR NON WOVEN STRL DISP B (DISPOSABLE) ×3 IMPLANT
WATER STERILE IRR 1000ML POUR (IV SOLUTION) ×3 IMPLANT
WATER STERILE IRR 1500ML POUR (IV SOLUTION) IMPLANT

## 2017-01-14 NOTE — H&P (Signed)
Gregory Ramos is an 76 y.o. male.    Chief Complaint: Pre-op Simple Prostatectomy  HPI:   1 - Massive Prostate with Recurrent Urinary Retention - Long h/o obstructive LUTS with weak stream, double void as well as nocturia x 5-6 x several years managed with various alpha blockers including Uroxatrol. Had acute worsening of symptoms early 02/2013 prompting eval at [facility] where foley placed and 1579mL urine obtained per report. Placed on finasteride + Uroxatrol and catheter free until 10/2016 when recurrent retention with ER visit for foley for 977mL in bladder. Prostate vol 274ml by CT 2014 with massive median lobe and VOl 233mL. UDS 2018 with obstructed picture with PDet 80 at [facility] 59ml/sec. Preserved sensation.   PMH sig for HTN, TB, LUTS. 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    Today "Gregory Ramos" is seen to proceed with robotic simple prostatectomy. 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   Past Medical History:  Diagnosis Date  . Hyperlipidemia   . Hypertension   . Prostate enlargement   . Sleep apnea    resolved with weight loss    Past Surgical History:  Procedure Laterality Date  . COLONOSCOPY N/A 02/20/2015   Procedure: COLONOSCOPY;  Surgeon: Robert Bellow, MD;  Location: [facility] ENDOSCOPY;  Service: Endoscopy;  Laterality: N/A;  . CYST REMOVAL HAND  20947  . PROSTATE BIOPSY  2000?    No family history on file. Social History:  reports that he has quit smoking. His smoking use included Cigarettes. He has a 15.00 pack-year smoking history. He has never used smokeless tobacco. He reports that he does not drink alcohol or use drugs.  Allergies: No Known Allergies  No prescriptions prior to admission.    No results found for this or any previous visit (from the past 48 hour(s)). No results found.  Review of Systems  Constitutional: Negative.  Negative for chills and fever.  HENT:  Negative.   Eyes: Negative.   Respiratory: Negative.   Cardiovascular: Negative.   Gastrointestinal: Negative.   Genitourinary: Negative.   Musculoskeletal: Negative.   Skin: Negative.     There were no vitals taken for this visit. Physical Exam  Constitutional: He appears well-developed.  HENT:  Head: Normocephalic.  Eyes: Pupils are equal, round, and reactive to light.  Neck: Normal range of motion.  Cardiovascular: Normal rate.   Respiratory: Effort normal.  GI: Soft.  Genitourinary:  Genitourinary Comments: In situ catheter with clear urine.  Musculoskeletal: Normal range of motion.  Neurological: He is alert.  Skin: Skin is warm.  Psychiatric: He has a normal mood and affect.     Assessment/Plan  1 - Massive Prostate with Recurrent Urinary Retention - proceed as planned with robotic simple prostatectomy. Risks, benefits, alternatives, expected peri-op.   [name]  Alexis Frock, MD 01/14/2017, 8:33 AM

## 2017-01-14 NOTE — Discharge Instructions (Signed)

## 2017-01-14 NOTE — Anesthesia Preprocedure Evaluation (Addendum)
Anesthesia Evaluation  Patient identified by MRN, date of birth, ID band Patient awake    Reviewed: Allergy & Precautions, NPO status , Patient's Chart, lab work & pertinent test results  Airway Mallampati: III  TM Distance: >3 FB Neck ROM: Full    Dental  (+) Teeth Intact, Dental Advisory Given   Pulmonary sleep apnea , former smoker,    breath sounds clear to auscultation       Cardiovascular hypertension,  Rhythm:Regular Rate:Normal     Neuro/Psych    GI/Hepatic negative GI ROS, Neg liver ROS,   Endo/Other  negative endocrine ROS  Renal/GU negative Renal ROS     Musculoskeletal   Abdominal (+) + obese,   Peds  Hematology   Anesthesia Other Findings   Reproductive/Obstetrics                           Anesthesia Physical Anesthesia Plan  ASA: III  Anesthesia Plan: General   Post-op Pain Management:    Induction: Intravenous  Airway Management Planned: Oral ETT  Additional Equipment:   Intra-op Plan:   Post-operative Plan: Possible Post-op intubation/ventilation and Extubation in OR  Informed Consent: I have reviewed the patients History and Physical, chart, labs and discussed the procedure including the risks, benefits and alternatives for the proposed anesthesia with the patient or authorized representative who has indicated his/her understanding and acceptance.   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 Previous ECGs have showed 1st degree AV block, no previous cardiac history.  Patient denies chest pain, dizziness, palpitations. Rhythm strips today show 1st degree AV block, no dropped beats. On metoprolol 50 mg per day History of sleep apnea  Discussed with Dr. Harrington Challenger [facility]) will hold metoprolol, give  atropine in OR for heart block.)       Anesthesia Quick Evaluation

## 2017-01-14 NOTE — Brief Op Note (Signed)
01/14/2017  3:33 PM  PATIENT:  Gregory Ramos  76 y.o. male  PRE-OPERATIVE DIAGNOSIS:  MASSIVE PROSTATE WITH RETENTION  POST-OPERATIVE DIAGNOSIS:  MASSIVE PROSTATE WITH RETENTION  PROCEDURE:  Procedure(s): XI ROBOTIC ASSISTED SIMPLE PROSTATECTOMY (N/A)  SURGEON:  Surgeon(s) and Role:    * Alexis Frock, MD - Primary  PHYSICIAN ASSISTANT:   ASSISTANTS: Debbrah Alar PA   ANESTHESIA:   general  EBL:  Total I/O In: 1000 [I.V.:1000] Out: 100 [Blood:100]  BLOOD ADMINISTERED:none  DRAINS: 1 - JP to bulb, 2 - Catheter to drainage   LOCAL MEDICATIONS USED:  MARCAINE     SPECIMEN:  Source of Specimen:  prostate adenoma  DISPOSITION OF SPECIMEN:  PATHOLOGY  COUNTS:  YES  TOURNIQUET:  * No tourniquets in log *  DICTATION: .Other Dictation: Dictation Number  [id number]  PLAN OF CARE: Admit to inpatient   PATIENT DISPOSITION:  PACU - hemodynamically stable.   Delay start of Pharmacological VTE agent (>24hrs) due to surgical blood loss or risk of bleeding: yes

## 2017-01-14 NOTE — Anesthesia Procedure Notes (Signed)
Procedure Name: Intubation Date/Time: 01/14/2017 12:24 PM Performed by: Dione Booze Pre-anesthesia Checklist: Patient identified, Emergency Drugs available, Suction available and Patient being monitored Patient Re-evaluated:Patient Re-evaluated prior to inductionOxygen Delivery Method: Circle system utilized Preoxygenation: Pre-oxygenation with 100% oxygen Laryngoscope Size: Mac Grade View: Grade II Tube type: Oral Tube size: 7.5 mm Number of attempts: 1 Airway Equipment and Method: Stylet Placement Confirmation: ETT inserted through vocal cords under direct vision,  positive ETCO2 and breath sounds checked- equal and bilateral Secured at: 23 cm Tube secured with: Tape Dental Injury: Teeth and Oropharynx as per pre-operative assessment

## 2017-01-14 NOTE — Progress Notes (Signed)
Showed 12lead EKG from4/12/18 showing Mobitz I interpretation from cardiologist reading.  Placed on cardiac monitor showing sinus bradycardia to NSR with prolonged 1st degree block.  Dr. Linna Caprice has discussed with cardiologist Dr. Harrington Challenger.  States OK to proceed with planned surgery.

## 2017-01-14 NOTE — Interval H&P Note (Signed)
History and Physical Interval Note:  01/14/2017 12:00 PM  Gregory Ramos  has presented today for surgery, with the diagnosis of MASSIVE PROSTATE WITH RETENTION  The various methods of treatment have been discussed with the patient and family. After consideration of risks, benefits and other options for treatment, the patient has consented to  Procedure(s): XI ROBOTIC [place] (N/A) as a surgical intervention .  The patient's history has been reviewed, patient examined, no change in status, stable for surgery.  I have reviewed the patient's chart and labs.  Questions were answered to the patient's satisfaction.     Lari Linson

## 2017-01-14 NOTE — Progress Notes (Signed)
Anesthesiology Note:  76 year old male with severe BPH with  urinary retention who underwent robotic simple prostatectomy today under general anesthesia. He has a history of hypertension on metoprolol 50 mg QD and lisinopril/HCTZ 20/25 QD. He denies any history of heart disease, dizziness, chest pain or palpitations.  Pre-op ECG on 01/08/17 showed 1st degree AV block with Mobitz I  rhythm. Pre-op rhythm strips today and  showed sinus rhythm with first degree AV block.  I discussed these findings with Dr. Harrington Challenger [facility]). She recommended  holding metoprolol for the remainder of his hospitalization and do not restart until he has follow-up with his primary MD.  He underwent robotic prostatectomy under general anesthesia without difficulty and he remained hemodynamically stable and in SR with first degree block throughout surgery and recovery.  Plan to hold metoprolol and have him follow-up with primary MD after discharge.

## 2017-01-14 NOTE — Anesthesia Postprocedure Evaluation (Addendum)
Anesthesia Post Note  [name]  Procedure(s) Performed: Procedure(s) (LRB): XI ROBOTIC ASSISTED SIMPLE PROSTATECTOMY (N/A)  Patient location during evaluation: PACU Anesthesia Type: General Level of consciousness: awake, awake and alert and oriented Pain management: pain level controlled Vital Signs Assessment: post-procedure vital signs reviewed and stable Respiratory status: spontaneous breathing, nonlabored ventilation and respiratory function stable Cardiovascular status: blood pressure returned to baseline Anesthetic complications: no       Last Vitals:  Vitals:   01/14/17 1700 01/14/17 1720  BP: (!) 160/98 (!) 159/78  Pulse: 68 74  Resp: 16 16  Temp: 36.8 C 36.6 C    Last Pain:  Vitals:   01/14/17 1700  TempSrc:   PainSc: Asleep                 Cherrie Franca COKER

## 2017-01-14 NOTE — Transfer of Care (Signed)
Immediate Anesthesia Transfer of Care Note  [name]  Procedure(s) Performed: Procedure(s): XI ROBOTIC ASSISTED SIMPLE PROSTATECTOMY (N/A)  Patient Location: PACU  Anesthesia Type:General  Level of Consciousness: awake, alert  and patient cooperative  Airway & Oxygen Therapy: Patient Spontanous Breathing and Patient connected to face mask oxygen  Post-op Assessment: Report given to RN and Post -op Vital signs reviewed and stable  Post vital signs: Reviewed and stable  Last Vitals:  Vitals:   01/14/17 1042  BP: (!) 167/93  Pulse: 67  Resp: 18  Temp: 36.9 C    Last Pain:  Vitals:   01/14/17 1042  TempSrc: Oral      Patients Stated Pain Goal: 4 (93/40/68 4033)  Complications: No apparent anesthesia complications

## 2017-01-15 ENCOUNTER — Encounter (HOSPITAL_COMMUNITY): Payer: Self-pay | Admitting: Urology

## 2017-01-15 DIAGNOSIS — N401 Enlarged prostate with lower urinary tract symptoms: Secondary | ICD-10-CM | POA: Diagnosis present

## 2017-01-15 DIAGNOSIS — Z87891 Personal history of nicotine dependence: Secondary | ICD-10-CM | POA: Diagnosis not present

## 2017-01-15 DIAGNOSIS — E785 Hyperlipidemia, unspecified: Secondary | ICD-10-CM | POA: Diagnosis present

## 2017-01-15 DIAGNOSIS — I10 Essential (primary) hypertension: Secondary | ICD-10-CM | POA: Diagnosis present

## 2017-01-15 DIAGNOSIS — N138 Other obstructive and reflux uropathy: Secondary | ICD-10-CM | POA: Diagnosis present

## 2017-01-15 DIAGNOSIS — G4730 Sleep apnea, unspecified: Secondary | ICD-10-CM | POA: Diagnosis present

## 2017-01-15 LAB — BASIC METABOLIC PANEL
Anion gap: 5 (ref 5–15)
BUN: 24 mg/dL — AB (ref 6–20)
CO2: 29 mmol/L (ref 22–32)
Calcium: 9.3 mg/dL (ref 8.9–10.3)
Chloride: 104 mmol/L (ref 101–111)
Creatinine, Ser: 1.85 mg/dL — ABNORMAL HIGH (ref 0.61–1.24)
GFR calc Af Amer: 40 mL/min — ABNORMAL LOW (ref >60)
GFR calc non Af Amer: 34 mL/min — ABNORMAL LOW (ref >60)
GLUCOSE: 146 mg/dL — AB (ref 65–99)
POTASSIUM: 4.9 mmol/L (ref 3.5–5.1)
Sodium: 138 mmol/L (ref 135–145)

## 2017-01-15 LAB — HEMOGLOBIN AND HEMATOCRIT, BLOOD
HCT: 36.7 % — ABNORMAL LOW (ref 39.0–52.0)
HEMOGLOBIN: 11.9 g/dL — AB (ref 13.0–17.0)

## 2017-01-15 MED ORDER — DOCUSATE SODIUM 100 MG PO CAPS
100.0000 mg | ORAL_CAPSULE | Freq: Two times a day (BID) | ORAL | Status: DC
  Administered 2017-01-15: 100 mg via ORAL
  Filled 2017-01-15: qty 1

## 2017-01-15 MED ORDER — SENNA 8.6 MG PO TABS: 1.0000 | ORAL_TABLET | Freq: Two times a day (BID) | ORAL | 0 refills | Status: DC

## 2017-01-15 MED ORDER — SENNA 8.6 MG PO TABS
1.0000 | ORAL_TABLET | Freq: Every day | ORAL | Status: DC
  Administered 2017-01-15: 8.6 mg via ORAL
  Filled 2017-01-15: qty 1

## 2017-01-15 NOTE — Discharge Summary (Signed)
Physician Discharge Summary  Patient ID: Gregory Ramos MRN: [id number] DOB/AGE: Jan 29, 1941 76 y.o.  Admit date: 01/14/2017 Discharge date: 01/15/2017  Admission Diagnoses: Massive Prostate with Urinary Retention  Discharge Diagnoses:  Active Problems:   BPH with urinary obstruction   Discharged Condition: good  Hospital Course:   1 - Massive Prostate with Urinary Retention - pt underwent robotic simple prostatectomy on 4/18, the day of admission, without acute complication. He was admitted to 4th floor Urology service post-op where he began his vigorous recovery. By the afternoon of POD 1 he is ambulatory, pain controlled with PO meds, maintaining PO nutrition and felt to be adequate for discharge. Hgb >11. JP removed as output scant prior to discharge.  Consults: None  Significant Diagnostic Studies: labs: as per above,   Treatments: surgery:  robotic simple prostatectomy on 4/18  Discharge Exam: Blood pressure (!) 142/80, pulse 68, temperature 98 F (36.7 C), temperature source Oral, resp. rate 16, height 5\' 11"  (1.803 m), weight 112 kg (247 lb), SpO2 100 %. General appearance: alert, cooperative and appears stated age Eyes: negative Nose: Nares normal. Septum midline. Mucosa normal. No drainage or sinus tenderness. Throat: lips, mucosa, and tongue normal; teeth and gums normal Neck: supple, symmetrical, trachea midline Back: symmetric, no curvature. ROM normal. No CVA tenderness. Resp: non-labored on room air Cardio: Nl rate GI: soft, non-tender; bowel sounds normal; no masses,  no organomegaly Male genitalia: normal, UNcirc'd. No phimosis or paraphimosis. Foely in situe with very light pink urien that is thing. .Irrigation port plugged.  Extremities: extremities normal, atraumatic, no cyanosis or edema Pulses: 2+ and symmetric Skin: Skin color, texture, turgor normal. No rashes or lesions Lymph nodes: Cervical, supraclavicular, and axillary nodes normal. Neurologic:  Grossly normal Incision/Wound: Recent port, drain, extraction sites c/d/I.   Disposition: 01-Home or Self Care   Allergies as of 01/15/2017   No Known Allergies     Medication List    STOP taking these medications   alfuzosin 10 MG 24 hr tablet Commonly known as:  UROXATRAL   finasteride 5 MG tablet Commonly known as:  PROSCAR   metoprolol 50 MG tablet Commonly known as:  LOPRESSOR     TAKE these medications   ciprofloxacin 500 MG tablet Commonly known as:  CIPRO Take 1 tablet (500 mg total) by mouth 2 (two) times daily. Start day prior to office visit for foley removal   HYDROcodone-acetaminophen 5-325 MG tablet Commonly known as:  NORCO Take 1-2 tablets by mouth every 6 (six) hours as needed for moderate pain or severe pain.   lisinopril-hydrochlorothiazide 20-25 MG tablet Commonly known as:  PRINZIDE,ZESTORETIC Take 1 tablet by mouth daily at 12 noon.   senna 8.6 MG Tabs tablet Commonly known as:  SENOKOT Take 1 tablet (8.6 mg total) by mouth 2 (two) times daily. While taking strong pain meds to prevent constipation.      Follow-up Information    Alexis Frock, MD Follow up on 01/26/2017.   Specialty:  Urology Why:  at 31 AM for MD visit and catheter removal Contact information: [address] [place] [address] [phone]           Signed: Alexis Frock 01/15/2017, 12:42 PM

## 2017-01-15 NOTE — Progress Notes (Signed)
UROLOGY PROGRESS NOTES  Assessment/Plan: Gregory Ramos is a 76 y.o. male with a history of HLD, HTN, OSA and massive BPH s/p RAL simple prostatectomy on 01/14/17 with Dr. Tresa Moore.   Interval/Plan: NAEON. Isolated tachy x 1 to 102 otherwise stable. Normotensive (to high BP). 600 UOP over night shift. 60cc JP drain. Hb 11.9 from 13.2. BMP pending.  - Saline lock - Regular diet - OOB/ambulate - Stool softeners - Continue JP until discharge - Possible discharge later today vs tomorrow - Continue Foley catheter - Wean oxygen   Subjective: Feels well. No n/v. Tolerating clears. Hungry. No flatus/OOB yet. Walked to bathroom last night because he thought he needed to have a BM. Pain controlled.   Objective:  Vital signs in last 24 hours: Temp:  [97.8 F (36.6 C)-99 F (37.2 C)] 98 F (36.7 C) (04/19 0535) Pulse Rate:  [65-77] 68 (04/19 0535) Resp:  [14-21] 16 (04/19 0535) BP: (118-167)/(78-98) 142/80 (04/19 0535) SpO2:  [92 %-100 %] 100 % (04/19 0535) Weight:  [112 kg (247 lb)] 112 kg (247 lb) (04/18 1058)  04/18 0701 - 04/19 0700 In: 2071.7 [I.V.:2021.7] Out: 760 [Urine:600; Drains:60; Blood:100]    Physical Exam:  General:  well-developed and well-nourished AAM in NAD, lying in bed, alert & oriented, pleasant HEENT: [place]/AT, EOMI, sclera anicteric, hearing grossly intact, no nasal discharge, MMM Respiratory: nonlabored respirations, satting well on RA, symmetrical chest rise Cardiovascular: pulse regular rate & rhythm Abdominal: soft, appropriately TTP, mildly distended, surgical incisions c/d/i without signs of exudate/erythema, JP drain with SS Drainage GU: foley draining dark yellow/brown thin urine with no clot burden Extremities: warm, well-perfused, no c/c/e Neuro: no focal deficits   Data Review: Results for orders placed or performed during the hospital encounter of 01/14/17 (from the past 24 hour(s))  Hemoglobin and hematocrit, blood     Status: None   Collection Time: 01/14/17  4:42 PM  Result Value Ref Range   Hemoglobin 13.2 13.0 - 17.0 g/dL   HCT 41.3 39.0 - 52.0 %  Hemoglobin and hematocrit, blood     Status: Abnormal   Collection Time: 01/15/17  7:08 AM  Result Value Ref Range   Hemoglobin 11.9 (L) 13.0 - 17.0 g/dL   HCT 36.7 (L) 39.0 - 52.0 %    Imaging: None  I have seen and examined the pateint and agree with plan as outlined by Dr. Lenord Fellers.

## 2017-01-15 NOTE — Op Note (Signed)
NAME:  Gregory Ramos, ARTS NO.:  [id number]  MEDICAL RECORD NO.:  [id number]  LOCATION:  ED37A                        FACILITY:  [facility]  PHYSICIAN:  Alexis Frock, MD     DATE OF BIRTH:  [birth year]  DATE OF PROCEDURE: 01/14/2017                                OPERATIVE REPORT   PREOPERATIVE DIAGNOSIS:  Massive prostatic hypertrophy with refractory urinary retention.  PROCEDURE:  Robotic-assisted laparoscopic simple prostatectomy.  ESTIMATED BLOOD LOSS:  100 mL.  DRAINS: 1. Jackson-Pratt drain with suction. 2. Foley catheter straight drain.  FINDINGS: 1. Massive trilobar prostatic hypertrophy as anticipated. 2. Small omental adhesions to the anterior abdominal wall.  These were     thin and amenable to limited adhesiolysis.  ASSISTANTS:  Debbrah Alar, PA.  INDICATION:  Gregory Ramos is a pleasant 76 year old gentleman with longstanding history of prostatic hypertrophy.  He has been on maximal medical therapy for quite some time with alpha-blockers and 5-alpha reductase inhibitors.  He unfortunately developed new urinary retention several months ago.  This has been quite refractory.  He underwent urodynamic study which corroborated preserved neuromuscular function of the bladder with high-pressure, zero flow state, and cystoscopy to rule out any stricture disease.  Options were discussed for further management including chronic catheters versus transurethral resection versus simple prostatectomy with the latter being the most definitive given gland size estimated over 200 g and he wished to proceed. Informed consent was obtained and placed in the medical record.  PROCEDURE IN DETAIL:  The patient being, Gregory Ramos, was verified. Procedure being robotic prostatectomy was confirmed.  Procedure was carried out.  Time-out was performed.  Intravenous antibiotics were administered.  General endotracheal anesthesia introduced.  The patient was placed into a low  lithotomy position.  Sterile field was created by prepping the patient's penis, perineum, proximal thighs using iodine and his infra-xiphoid abdomen using chlorhexidine gluconate.  After clipper shaving, he was further fashioned to the operating table using 3-inch tape over foam padding across the supraxiphoid and chest.  A test of steep Trendelenburg position was performed.  He was found to be suitably positioned.  Next, a high-flow, low-pressure pneumoperitoneum was obtained using Veress technique in the position approximately 4 cm above the umbilicus having passed the aspiration and drop test.  This port placement was achieved, specifically superior given the massive trilobar hypertrophy with a median lobe being quite superior.  Laparoscopic examination of the peritoneal cavity revealed some loose omental adhesions to the area of the umbilicus.  These were non-bowel containing whatsoever.  Additional ports were placed as follows; right paramedian 8 mm robotic port, right far lateral 12 mm AirSeal assistant port, right paramedian 5 mm assistant port, left paramedian 8 mm robotic port, left far lateral 8 mm robotic port.  Next, using a robotic camera via a lateral port, limited adhesiolysis was performed with laparoscopic technique and cold scissors were used to drop loose omental adhesions away from the area of the umbilicus.  Notably, there was no hernia defect.  There was no associated bowel whatsoever.  Robot was then undocked and passed through the electronic checks.  Initial attention was directed at development of space of Retzius.  Incision was  made lateral to the right medial umbilical ligament from midline towards the internal ring coursing along the iliac vessels towards the area of the ureter.  Vas deferens was encountered and purposely ligated.  A mirror- image dissection was performed on the left side.  Anterior attachments were taken down using cautery scissors toward area  of the endopelvic fascia.  The area of the bladder neck and prostate junction was defatted in the anterior plane to allow better visualization of this and the endopelvic fascia was very carefully swept away from the prostate just enough to allow visualization of the dorsal venous complex which was controlled using vascular stapler 45 mm load taking exquisite care to avoid any urethral injury which did not occur.  Next, a small cystotomy was made in a inverse semilinear fashion at a position estimated to be above the area of the lateral lobes at the level approximately at midportion of the median lobe.  Dissection was carried down into the level of the bladder and this was in fact in excellent position.  The ureters were prospectively identified and dissection stayed at least 2 cm away from this at all times.  Two large stay sutures were applied to the superior lateral aspect of the median lobe, right side and left side respectively and two additional stay sutures in the lateral lobes of the prostate, right and left respectively.  The median lobe was placed on gentle superior traction which allowed better identification of ureteral orifices and posterior dissection was began by incising into the bladder mucosa approximately 2 cm distal to the ureteral orifices and the adenoma plane was entered in a posterior plane and this was carried down toward the area of the prostatic apex.  The dissection stayed in the adenoma plane sweeping from posterior to anterior first on the right side and then the left side toward the area of the prostatic apex using point coagulation current.  Cautery scissors resulted in excellent hemostasis.  Final apical dissection was performed in the anterior plane.  This completely freed of the adenoma specimen, which was placed in extra large EndoCatch bag for later retrieval.  Very careful inspection was performed of the prostate capsule.  There was no evidence of  perforation noted whatsoever.  Digital rectal exam was performed under laparoscopic vision with indicator glove and no evidence of rectal perforation was noted as well.  Next, posterior mucosal advancement was performed using a double-armed V-Loc suture reapproximating the posterior urethra to the posterior bladder neck mucosa from a position approximately 4 o'clock to 8 o'clock on both structures, resulted in tension-free apposition of this and the excellent mucosal bridge posteriorly.  Next, the cystotomy was closed using two separate running suture lines of 2-0 Vicryl from the lateral inferior toward the 12 o'clock position, taking exquisite care to avoid back falling of these structures, which did not occur.  New Foley catheter was then easily placed, a 22-French, 15 mL sterile water in the balloon.  The irrigation port was plugged and this was completely without leaks whatsoever.  The lateral robotic arm was removed through which a closed suction drain was brought into the pelvis.  The right 12 mm port site was closed at the level of fascia using Carter-Thomason suture passer under laparoscopic vision.  Robot was then undocked.  Specimen retrieved by extending the camera port site inferiorly for total distance approximately 5 cm removing the large adenoma, setting aside from pathology.  The extraction site was closed at the level of the fascia  using figure-of- eight PDS x4 followed by reapproximation of Scarpa's with running Vicryl.  All incision sites were infiltrated with dilute lyophilized Marcaine and closed at the level of the skin using subcuticular Monocryl followed by Dermabond.  Drain stitch was applied and the procedure was terminated.  The patient tolerated the procedure well.  There were no immediate periprocedural complications.  The patient was taken to the postanesthesia care unit in stable condition.  Please note, first assistant, Debbrah Alar, was absolutely crucial  for all robotic portions of procedures today.  She provided invaluable retraction, vascular stapling, suctioning without which this would not be possible.    ______________________________ Alexis Frock, MD   ______________________________ Alexis Frock, MD    TM/MEDQ  D:  01/14/2017  T:  01/15/2017  Job:  [id number]

## 2017-01-15 NOTE — Op Note (Deleted)
  The note originally documented on this encounter has been moved the the encounter in which it belongs.  

## 2017-02-10 ENCOUNTER — Ambulatory Visit: Payer: Self-pay | Admitting: General Surgery

## 2017-05-21 NOTE — Addendum Note (Signed)
Addendum  created 05/21/17 1324 by Roberts Gaudy, MD   Sign clinical note

## 2021-01-19 ENCOUNTER — Other Ambulatory Visit: Payer: Self-pay

## 2021-01-19 ENCOUNTER — Emergency Department: Payer: Medicare Other

## 2021-01-19 ENCOUNTER — Inpatient Hospital Stay
Admission: EM | Admit: 2021-01-19 | Discharge: 2021-01-25 | DRG: 374 | Disposition: A | Discharge status: Home / Self Care | Payer: Medicare Other | Attending: Internal Medicine | Admitting: Internal Medicine

## 2021-01-19 DIAGNOSIS — Z20822 Contact with and (suspected) exposure to covid-19: Secondary | ICD-10-CM | POA: Diagnosis present

## 2021-01-19 DIAGNOSIS — I2722 Pulmonary hypertension due to left heart disease: Secondary | ICD-10-CM | POA: Diagnosis present

## 2021-01-19 DIAGNOSIS — R112 Nausea with vomiting, unspecified: Secondary | ICD-10-CM | POA: Diagnosis present

## 2021-01-19 DIAGNOSIS — I441 Atrioventricular block, second degree: Secondary | ICD-10-CM

## 2021-01-19 DIAGNOSIS — J9601 Acute respiratory failure with hypoxia: Secondary | ICD-10-CM | POA: Diagnosis not present

## 2021-01-19 DIAGNOSIS — D500 Iron deficiency anemia secondary to blood loss (chronic): Secondary | ICD-10-CM | POA: Diagnosis not present

## 2021-01-19 DIAGNOSIS — Z6831 Body mass index (BMI) 31.0-31.9, adult: Secondary | ICD-10-CM

## 2021-01-19 DIAGNOSIS — D7389 Other diseases of spleen: Secondary | ICD-10-CM | POA: Diagnosis present

## 2021-01-19 DIAGNOSIS — R911 Solitary pulmonary nodule: Secondary | ICD-10-CM | POA: Diagnosis present

## 2021-01-19 DIAGNOSIS — K5900 Constipation, unspecified: Secondary | ICD-10-CM | POA: Diagnosis present

## 2021-01-19 DIAGNOSIS — D62 Acute posthemorrhagic anemia: Secondary | ICD-10-CM | POA: Diagnosis present

## 2021-01-19 DIAGNOSIS — D3501 Benign neoplasm of right adrenal gland: Secondary | ICD-10-CM | POA: Diagnosis present

## 2021-01-19 DIAGNOSIS — I5032 Chronic diastolic (congestive) heart failure: Secondary | ICD-10-CM | POA: Diagnosis present

## 2021-01-19 DIAGNOSIS — M79605 Pain in left leg: Secondary | ICD-10-CM

## 2021-01-19 DIAGNOSIS — D3502 Benign neoplasm of left adrenal gland: Secondary | ICD-10-CM | POA: Diagnosis present

## 2021-01-19 DIAGNOSIS — K921 Melena: Secondary | ICD-10-CM

## 2021-01-19 DIAGNOSIS — K644 Residual hemorrhoidal skin tags: Secondary | ICD-10-CM | POA: Diagnosis present

## 2021-01-19 DIAGNOSIS — E669 Obesity, unspecified: Secondary | ICD-10-CM | POA: Diagnosis present

## 2021-01-19 DIAGNOSIS — Z96649 Presence of unspecified artificial hip joint: Secondary | ICD-10-CM | POA: Diagnosis present

## 2021-01-19 DIAGNOSIS — N1832 Chronic kidney disease, stage 3b: Secondary | ICD-10-CM

## 2021-01-19 DIAGNOSIS — D649 Anemia, unspecified: Secondary | ICD-10-CM | POA: Diagnosis not present

## 2021-01-19 DIAGNOSIS — C189 Malignant neoplasm of colon, unspecified: Secondary | ICD-10-CM | POA: Diagnosis not present

## 2021-01-19 DIAGNOSIS — A419 Sepsis, unspecified organism: Secondary | ICD-10-CM | POA: Diagnosis not present

## 2021-01-19 DIAGNOSIS — I10 Essential (primary) hypertension: Secondary | ICD-10-CM | POA: Diagnosis present

## 2021-01-19 DIAGNOSIS — R9431 Abnormal electrocardiogram [ECG] [EKG]: Secondary | ICD-10-CM | POA: Diagnosis not present

## 2021-01-19 DIAGNOSIS — C186 Malignant neoplasm of descending colon: Secondary | ICD-10-CM | POA: Diagnosis present

## 2021-01-19 DIAGNOSIS — R0902 Hypoxemia: Secondary | ICD-10-CM | POA: Diagnosis not present

## 2021-01-19 DIAGNOSIS — E785 Hyperlipidemia, unspecified: Secondary | ICD-10-CM | POA: Diagnosis present

## 2021-01-19 DIAGNOSIS — I361 Nonrheumatic tricuspid (valve) insufficiency: Secondary | ICD-10-CM | POA: Diagnosis not present

## 2021-01-19 DIAGNOSIS — K648 Other hemorrhoids: Secondary | ICD-10-CM | POA: Diagnosis present

## 2021-01-19 DIAGNOSIS — R0600 Dyspnea, unspecified: Secondary | ICD-10-CM

## 2021-01-19 DIAGNOSIS — R519 Headache, unspecified: Secondary | ICD-10-CM | POA: Diagnosis present

## 2021-01-19 DIAGNOSIS — R42 Dizziness and giddiness: Secondary | ICD-10-CM

## 2021-01-19 DIAGNOSIS — N183 Chronic kidney disease, stage 3 unspecified: Secondary | ICD-10-CM | POA: Diagnosis not present

## 2021-01-19 DIAGNOSIS — R001 Bradycardia, unspecified: Secondary | ICD-10-CM | POA: Diagnosis present

## 2021-01-19 DIAGNOSIS — R6520 Severe sepsis without septic shock: Secondary | ICD-10-CM | POA: Diagnosis not present

## 2021-01-19 DIAGNOSIS — K635 Polyp of colon: Secondary | ICD-10-CM | POA: Diagnosis not present

## 2021-01-19 DIAGNOSIS — M79604 Pain in right leg: Secondary | ICD-10-CM

## 2021-01-19 DIAGNOSIS — K6389 Other specified diseases of intestine: Secondary | ICD-10-CM | POA: Diagnosis not present

## 2021-01-19 DIAGNOSIS — I340 Nonrheumatic mitral (valve) insufficiency: Secondary | ICD-10-CM | POA: Diagnosis not present

## 2021-01-19 DIAGNOSIS — Z87891 Personal history of nicotine dependence: Secondary | ICD-10-CM | POA: Diagnosis not present

## 2021-01-19 DIAGNOSIS — I130 Hypertensive heart and chronic kidney disease with heart failure and stage 1 through stage 4 chronic kidney disease, or unspecified chronic kidney disease: Secondary | ICD-10-CM | POA: Diagnosis present

## 2021-01-19 DIAGNOSIS — K922 Gastrointestinal hemorrhage, unspecified: Secondary | ICD-10-CM

## 2021-01-19 DIAGNOSIS — J189 Pneumonia, unspecified organism: Secondary | ICD-10-CM | POA: Diagnosis not present

## 2021-01-19 DIAGNOSIS — R1110 Vomiting, unspecified: Secondary | ICD-10-CM

## 2021-01-19 DIAGNOSIS — I440 Atrioventricular block, first degree: Secondary | ICD-10-CM | POA: Diagnosis present

## 2021-01-19 DIAGNOSIS — D509 Iron deficiency anemia, unspecified: Secondary | ICD-10-CM | POA: Diagnosis not present

## 2021-01-19 DIAGNOSIS — Z79899 Other long term (current) drug therapy: Secondary | ICD-10-CM

## 2021-01-19 LAB — COMPREHENSIVE METABOLIC PANEL
ALT: 9 U/L (ref 0–44)
AST: 13 U/L — ABNORMAL LOW (ref 15–41)
Albumin: 3.5 g/dL (ref 3.5–5.0)
Alkaline Phosphatase: 68 U/L (ref 38–126)
Anion gap: 9 (ref 5–15)
BUN: 27 mg/dL — ABNORMAL HIGH (ref 8–23)
CO2: 25 mmol/L (ref 22–32)
Calcium: 10.1 mg/dL (ref 8.9–10.3)
Chloride: 107 mmol/L (ref 98–111)
Creatinine, Ser: 2.07 mg/dL — ABNORMAL HIGH (ref 0.61–1.24)
GFR, Estimated: 32 mL/min — ABNORMAL LOW (ref >60)
Glucose, Bld: 125 mg/dL — ABNORMAL HIGH (ref 70–99)
Potassium: 3.8 mmol/L (ref 3.5–5.1)
Sodium: 141 mmol/L (ref 135–145)
Total Bilirubin: 0.6 mg/dL (ref 0.3–1.2)
Total Protein: 6.8 g/dL (ref 6.5–8.1)

## 2021-01-19 LAB — RETICULOCYTES
Immature Retic Fract: 21.9 % — ABNORMAL HIGH (ref 2.3–15.9)
RBC.: 3.43 MIL/uL — ABNORMAL LOW (ref 4.22–5.81)
Retic Count, Absolute: 72.7 10*3/uL (ref 19.0–186.0)
Retic Ct Pct: 2.1 % (ref 0.4–3.1)

## 2021-01-19 LAB — IRON AND TIBC
Iron: 19 ug/dL — ABNORMAL LOW (ref 45–182)
Saturation Ratios: 5 % — ABNORMAL LOW (ref 17.9–39.5)
TIBC: 396 ug/dL (ref 250–450)
UIBC: 377 ug/dL

## 2021-01-19 LAB — TYPE AND SCREEN
ABO/RH(D): O POS
Antibody Screen: NEGATIVE

## 2021-01-19 LAB — CBC
HCT: 29.1 % — ABNORMAL LOW (ref 39.0–52.0)
Hemoglobin: 8.8 g/dL — ABNORMAL LOW (ref 13.0–17.0)
MCH: 26.3 pg (ref 26.0–34.0)
MCHC: 30.2 g/dL (ref 30.0–36.0)
MCV: 87.1 fL (ref 80.0–100.0)
Platelets: 346 10*3/uL (ref 150–400)
RBC: 3.34 MIL/uL — ABNORMAL LOW (ref 4.22–5.81)
RDW: 14.2 % (ref 11.5–15.5)
WBC: 8.8 10*3/uL (ref 4.0–10.5)
nRBC: 0 % (ref 0.0–0.2)

## 2021-01-19 LAB — FERRITIN: Ferritin: 9 ng/mL — ABNORMAL LOW (ref 24–336)

## 2021-01-19 LAB — LACTATE DEHYDROGENASE: LDH: 119 U/L (ref 98–192)

## 2021-01-19 LAB — RESP PANEL BY RT-PCR (FLU A&B, COVID) ARPGX2
Influenza A by PCR: NEGATIVE
Influenza B by PCR: NEGATIVE
SARS Coronavirus 2 by RT PCR: NEGATIVE

## 2021-01-19 LAB — APTT: aPTT: 30 seconds (ref 24–36)

## 2021-01-19 LAB — PROTIME-INR
INR: 1.1 (ref 0.8–1.2)
Prothrombin Time: 14.5 seconds (ref 11.4–15.2)

## 2021-01-19 LAB — LIPASE, BLOOD: Lipase: 32 U/L (ref 11–51)

## 2021-01-19 MED ORDER — SODIUM CHLORIDE 0.9 % IV SOLN: 6.2500 mg | Freq: Four times a day (QID) | INTRAVENOUS | Status: DC | PRN

## 2021-01-19 MED ORDER — ONDANSETRON HCL 4 MG PO TABS: 4.0000 mg | ORAL_TABLET | Freq: Four times a day (QID) | ORAL | Status: DC | PRN

## 2021-01-19 MED ORDER — KCL IN DEXTROSE-NACL 40-5-0.9 MEQ/L-%-% IV SOLN
INTRAVENOUS | Status: DC
  Filled 2021-01-19 (×3): qty 1000

## 2021-01-19 MED ORDER — PANTOPRAZOLE SODIUM 40 MG IV SOLR
40.0000 mg | Freq: Two times a day (BID) | INTRAVENOUS | Status: DC
  Administered 2021-01-23 – 2021-01-25 (×5): 40 mg via INTRAVENOUS
  Filled 2021-01-19 (×5): qty 40

## 2021-01-19 MED ORDER — SODIUM CHLORIDE 0.9 % IV BOLUS
1000.0000 mL | Freq: Once | INTRAVENOUS | Status: AC
Start: 2021-01-19 — End: 2021-01-19
  Administered 2021-01-19: 1000 mL via INTRAVENOUS

## 2021-01-19 MED ORDER — ACETAMINOPHEN 325 MG PO TABS
650.0000 mg | ORAL_TABLET | Freq: Four times a day (QID) | ORAL | Status: DC | PRN
  Administered 2021-01-23: 650 mg via ORAL
  Filled 2021-01-19: qty 2

## 2021-01-19 MED ORDER — ONDANSETRON HCL 4 MG/2ML IJ SOLN: 4.0000 mg | Freq: Four times a day (QID) | INTRAMUSCULAR | Status: DC | PRN

## 2021-01-19 MED ORDER — LABETALOL HCL 5 MG/ML IV SOLN: 10.0000 mg | Freq: Four times a day (QID) | INTRAVENOUS | Status: DC | PRN

## 2021-01-19 MED ORDER — MORPHINE SULFATE (PF) 2 MG/ML IV SOLN: 2.0000 mg | INTRAVENOUS | Status: DC | PRN

## 2021-01-19 MED ORDER — SODIUM CHLORIDE 0.9 % IV SOLN
8.0000 mg/h | INTRAVENOUS | Status: DC
  Administered 2021-01-19 – 2021-01-20 (×3): 8 mg/h via INTRAVENOUS
  Filled 2021-01-19 (×6): qty 80

## 2021-01-19 MED ORDER — SODIUM CHLORIDE 0.9 % IV SOLN
80.0000 mg | Freq: Once | INTRAVENOUS | Status: AC
  Administered 2021-01-19: 80 mg via INTRAVENOUS
  Filled 2021-01-19: qty 80

## 2021-01-19 MED ORDER — ONDANSETRON HCL 4 MG/2ML IJ SOLN
4.0000 mg | Freq: Once | INTRAMUSCULAR | Status: AC
  Administered 2021-01-19: 4 mg via INTRAVENOUS
  Filled 2021-01-19: qty 2

## 2021-01-19 MED ORDER — ACETAMINOPHEN 650 MG RE SUPP: 650.0000 mg | Freq: Four times a day (QID) | RECTAL | Status: DC | PRN

## 2021-01-19 NOTE — ED Notes (Addendum)
Pt transported to floor at this time by Darrold Span.

## 2021-01-19 NOTE — H&P (Signed)
History and Physical    Gregory Ramos:[id number] DOB: Nov 18, 1940 DOA: 01/19/2021  PCP: Marden Noble, MD   Patient coming from: Home  I have personally briefly reviewed patient's old medical records in [place]  Chief Complaint: Vomiting, lightheadedness, blood in stool  HPI: Gregory Ramos is a 80 y.o. male with medical history significant for HTN who presents to the emergency room with vomiting and dizziness that started a couple hours prior to presentation.  Vomiting was nonbloody and without coffee grounds and nonbilious.  It was associated with lightheadedness.  He has no chest pain or shortness of breath and denies abdominal pain.  He reports for the past 2 weeks he has been seeing bright red blood mixed in with his stool.  Last colonoscopy was in 2016 and showed 3 benign polyps.  While in the emergency room, patient was noted to be hypoxic with O2 sats down to the low 90s.  Patient denied cough, fever or chills or shortness of breath ED course: On arrival, afebrile, BP 189/89, pulse 75, respirations 26 with O2 sat 93% on room air going as low as 88%, improving to 94% on 2 L.  Blood work significant for hemoglobin of 8.8 with iron of 19.  Creatinine 2.07 which is his baseline compared to 08/2019.  Lipase 32. EKG, Mobitz type II, rate of 47, rate of 47(post admission addendum) Imaging: Chest x-ray: Increasing Pulmonary nodule left upper lobe with recommendation for elective CT.  Otherwise nonacute.  Patient started on Protonix bolus and infusion and placed on oxygen.  Hospitalist consulted for admission.  Review of Systems: As per HPI otherwise all other systems on review of systems negative.    Past Medical History:  Diagnosis Date  . Hyperlipidemia   . Hypertension   . Prostate enlargement   . Sleep apnea    resolved with weight loss    Past Surgical History:  Procedure Laterality Date  . COLONOSCOPY N/A 02/20/2015   Procedure: COLONOSCOPY;  Surgeon:  Robert Bellow, MD;  Location: [facility] ENDOSCOPY;  Service: Endoscopy;  Laterality: N/A;  . CYST REMOVAL HAND  08144  . JOINT REPLACEMENT     hip  . PROSTATE BIOPSY  2000?  Marland Kitchen XI ROBOTIC ASSISTED SIMPLE PROSTATECTOMY N/A 01/14/2017   Procedure: XI ROBOTIC ASSISTED SIMPLE PROSTATECTOMY;  Surgeon: Alexis Frock, MD;  Location: [facility] ORS;  Service: Urology;  Laterality: N/A;     reports that he has quit smoking. His smoking use included cigarettes. He has a 15.00 pack-year smoking history. He has never used smokeless tobacco. He reports that he does not drink alcohol and does not use drugs.  No Known Allergies  History reviewed. No pertinent family history.    Prior to Admission medications   Medication Sig Start Date End Date Taking? Authorizing Provider  ciprofloxacin (CIPRO) 500 MG tablet Take 1 tablet (500 mg total) by mouth 2 (two) times daily. Start day prior to office visit for foley removal 01/14/17   Debbrah Alar, PA-C  HYDROcodone-acetaminophen (NORCO) 5-325 MG tablet Take 1-2 tablets by mouth every 6 (six) hours as needed for moderate pain or severe pain. 01/14/17   Debbrah Alar, PA-C  lisinopril-hydrochlorothiazide (PRINZIDE,ZESTORETIC) 20-25 MG per tablet Take 1 tablet by mouth daily at 12 noon.  01/02/15   [provider]  senna (SENOKOT) 8.6 MG TABS tablet Take 1 tablet (8.6 mg total) by mouth 2 (two) times daily. While taking strong pain meds to prevent constipation. 01/15/17   Manny,  Hubbard Robinson, MD    Physical Exam: Vitals:   01/19/21 1915 01/19/21 1922 01/19/21 1923 01/19/21 1930  BP:    (!) 149/83  Pulse: 73   70  Resp: 16   19  Temp:      TempSrc:      SpO2: 93% (!) 88% 94% 98%  Weight:      Height:         Vitals:   01/19/21 1915 01/19/21 1922 01/19/21 1923 01/19/21 1930  BP:    (!) 149/83  Pulse: 73   70  Resp: 16   19  Temp:      TempSrc:      SpO2: 93% (!) 88% 94% 98%  Weight:      Height:          Constitutional: Alert and oriented x 3 . Not  in any apparent distress HEENT:      Head: Normocephalic and atraumatic.         Eyes: PERLA, EOMI, Conjunctivae are normal. Sclera is non-icteric.       Mouth/Throat: Mucous membranes are moist.       Neck: Supple with no signs of meningismus. Cardiovascular: Regular rate and rhythm. No murmurs, gallops, or rubs. 2+ symmetrical distal pulses are present . No JVD. No LE edema Respiratory: Respiratory effort normal .Lungs sounds clear bilaterally. No wheezes, crackles, or rhonchi.  Gastrointestinal: Soft, non tender, and non distended with positive bowel sounds.  Genitourinary: No CVA tenderness. Musculoskeletal: Nontender with normal range of motion in all extremities. No cyanosis, or erythema of extremities. Neurologic:  Face is symmetric. Moving all extremities. No gross focal neurologic deficits . Skin: Skin is warm, dry.  No rash or ulcers Psychiatric: Mood and affect are normal    Labs on Admission: I have personally reviewed following labs and imaging studies  CBC: Recent Labs  Lab 01/19/21 1850  WBC 8.8  HGB 8.8*  HCT 29.1*  MCV 87.1  PLT 026   Basic Metabolic Panel: Recent Labs  Lab 01/19/21 1850  NA 141  K 3.8  CL 107  CO2 25  GLUCOSE 125*  BUN 27*  CREATININE 2.07*  CALCIUM 10.1   GFR: Estimated Creatinine Clearance: 36.2 mL/min (A) (by C-G formula based on SCr of 2.07 mg/dL (H)). Liver Function Tests: Recent Labs  Lab 01/19/21 1850  AST 13*  ALT 9  ALKPHOS 68  BILITOT 0.6  PROT 6.8  ALBUMIN 3.5   Recent Labs  Lab 01/19/21 1850  LIPASE 32   No results for input(s): AMMONIA in the last 168 hours. Coagulation Profile: Recent Labs  Lab 01/19/21 1850  INR 1.1   Cardiac Enzymes: No results for input(s): CKTOTAL, CKMB, CKMBINDEX, TROPONINI in the last 168 hours. BNP (last 3 results) No results for input(s): PROBNP in the last 8760 hours. HbA1C: No results for input(s): HGBA1C in the last 72 hours. CBG: No results for input(s): GLUCAP in the  last 168 hours. Lipid Profile: No results for input(s): CHOL, HDL, LDLCALC, TRIG, CHOLHDL, LDLDIRECT in the last 72 hours. Thyroid Function Tests: No results for input(s): TSH, T4TOTAL, FREET4, T3FREE, THYROIDAB in the last 72 hours. Anemia Panel: Recent Labs    01/19/21 1850  FERRITIN 9*  TIBC 396  IRON 19*  RETICCTPCT 2.1   Urine analysis:    Component Value Date/Time   COLORURINE AMBER (A) 12/06/2016 0955   APPEARANCEUR CLEAR (A) 12/06/2016 0955   APPEARANCEUR Cloudy 03/14/2013 0352   LABSPEC 1.010 12/06/2016 0955  LABSPEC 1.011 03/14/2013 0352   PHURINE 6.0 12/06/2016 0955   GLUCOSEU NEGATIVE 12/06/2016 0955   GLUCOSEU Negative 03/14/2013 0352   HGBUR LARGE (A) 12/06/2016 0955   BILIRUBINUR NEGATIVE 12/06/2016 0955   BILIRUBINUR Negative 03/14/2013 0352   KETONESUR NEGATIVE 12/06/2016 0955   PROTEINUR 100 (A) 12/06/2016 0955   NITRITE NEGATIVE 12/06/2016 0955   LEUKOCYTESUR NEGATIVE 12/06/2016 0955   LEUKOCYTESUR Trace 03/14/2013 0352    Radiological Exams on Admission: DG Chest Portable 1 View  Result Date: 01/19/2021 CLINICAL DATA:  Shortness of breath.  Weakness and dizziness. EXAM: PORTABLE CHEST 1 VIEW COMPARISON:  03/14/2013, chest CT 01/16/2011 FINDINGS: Lung volumes are low. Upper normal heart size likely accentuated by low volume technique. Aortic atherosclerosis and tortuosity linear bibasilar opacities typical of atelectasis or scarring. Smoothly marginated 19 mm nodular density projects over the left upper lung zone of the level of posterior fifth rib. No pneumothorax or large pleural effusion. No acute osseous abnormalities are seen. IMPRESSION: 1. Low lung volumes with bibasilar atelectasis or scarring. 2. Smoothly marginated 19 mm nodular density projects over the left upper lung zone. Left upper lobe pulmonary nodule previously measured 12 mm on 2012 CT. Given increase in size, recommend follow-up chest CT. This could be performed on an elective nonemergent  basis. Electronically Signed   By: Keith Rake M.D.   On: 01/19/2021 19:58     Assessment/Plan 80 year old male with history of HTN presenting with nonbloody, nonbilious none coffee-ground vomiting and dizziness preceded by a 2-week history of intermittent bright red blood in stool.  Last colonoscopy was in 2016 and showed 3 benign polyps.      Symptomatic anemia due to acute/subacute GI blood loss   Hematochezia - Hemoglobin of 8.8, down from baseline of 10 in December 2020 - Likely GI blood loss of undetermined acuity/chronicity - Patient reports bright red blood in stool.  Vomitus was nonbloody and without coffee grounds -Last colonoscopy was in 2016 and showed 3 benign polyps.  - N.p.o. except for ice chips - Continue Protonix infusion - GI consult  Vomiting, possible gastritis - Possibly related to gastritis but no complaints of abdominal pain - Continue Protonix infusion - IV hydration, IV antiemetics    Hypoxia - Patient noted to be hypoxic in the ER with O2 sats down to as low as 88 - Continue O2 to keep sats 94 and above - Chest x-ray with no evidence of aspiration - Monitor for signs/symptoms of aspiration  Second-degree AV block - EKG done post admission shows second-degree AV block Mobitz type II - Patient asymptomatic - Cardiology consult - Continue cardiac monitoring - Avoid AV nodal blockers  Essential hypertension - Hydralazine as needed BP over 160/90 while n.p.o.    Stage 3b chronic kidney disease (HCC) - Creatinine 2.07, same as 2.1 from December 2020    Pulmonary nodule 1 cm or greater in diameter, left upper lobe - Chest x-ray revealed increase in size of known left upper lobe pulmonary nodule with recommendation for elective CT surveillance    DVT prophylaxis: SCDs Code Status: full code  Family Communication:  none  Disposition Plan: Back to previous home environment Consults called: GI Status:At the time of admission, it appears that the  appropriate admission status for this patient is INPATIENT. This is judged to be reasonable and necessary in order to provide the required intensity of service to ensure the patient's safety given the presenting symptoms, physical exam findings, and initial radiographic and laboratory data in the  context of their  Comorbid conditions.   Patient requires inpatient status due to high intensity of service, high risk for further deterioration and high frequency of surveillance required.   I certify that at the point of admission it is my clinical judgment that the patient will require inpatient hospital care spanning beyond [place] MD [facility]     01/19/2021, 8:56 PM

## 2021-01-19 NOTE — ED Provider Notes (Addendum)
[facility] Emergency Department Provider Note  ____________________________________________   Event Date/Time   First MD Initiated Contact with Patient 01/19/21 1850     (approximate)  I have reviewed the triage vital signs and the nursing notes.   HISTORY  Chief Complaint Dizziness and Emesis    HPI Gregory Ramos is a 80 y.o. male with hypertension, hyperlipidemia who comes in with dizziness.  Patient reports that he has had over 2 weeks of intermittent bright red blood per rectum.  Nothing makes better, nothing makes it worse.  Mostly mixed into the stool.  Denies ever having this previously.  States today around 2 hours ago he developed sudden onset of vomiting and dizziness.          Past Medical History:  Diagnosis Date  . Hyperlipidemia   . Hypertension   . Prostate enlargement   . Sleep apnea    resolved with weight loss    Patient Active Problem List   Diagnosis Date Noted  . 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 Encounter for screening colonoscopy 01/18/2015    Past Surgical History:  Procedure Laterality Date  . COLONOSCOPY N/A 02/20/2015   Procedure: COLONOSCOPY;  Surgeon: Robert Bellow, MD;  Location: [facility] ENDOSCOPY;  Service: Endoscopy;  Laterality: N/A;  . CYST REMOVAL HAND  09604  . JOINT REPLACEMENT     hip  . PROSTATE BIOPSY  2000?  Marland Kitchen XI ROBOTIC ASSISTED SIMPLE PROSTATECTOMY N/A 01/14/2017   Procedure: XI ROBOTIC ASSISTED SIMPLE PROSTATECTOMY;  Surgeon: Alexis Frock, MD;  Location: [facility] ORS;  Service: Urology;  Laterality: N/A;    Prior to Admission medications   Medication Sig Start Date End Date Taking? Authorizing Provider  ciprofloxacin (CIPRO) 500 MG tablet Take 1 tablet (500 mg total) by mouth 2 (two) times daily. Start day prior to office visit for foley removal 01/14/17   Debbrah Alar, PA-C  HYDROcodone-acetaminophen (NORCO) 5-325 MG tablet Take 1-2 tablets by mouth every 6 (six) hours as needed for  moderate pain or severe pain. 01/14/17   Debbrah Alar, PA-C  lisinopril-hydrochlorothiazide (PRINZIDE,ZESTORETIC) 20-25 MG per tablet Take 1 tablet by mouth daily at 12 noon.  01/02/15   [provider]  senna (SENOKOT) 8.6 MG TABS tablet Take 1 tablet (8.6 mg total) by mouth 2 (two) times daily. While taking strong pain meds to prevent constipation. 01/15/17   Alexis Frock, MD    Allergies Patient has no known allergies.  No family history on file.  Social History Social History   Tobacco Use  . Smoking status: Former Smoker    Packs/day: 1.00    Years: 15.00    Pack years: 15.00    Types: Cigarettes  . Smokeless tobacco: Never Used  Substance Use Topics  . Alcohol use: No    Alcohol/week: 0.0 standard drinks  . Drug use: No      Review of Systems Constitutional: No fever/chills, positive dizziness Eyes: No visual changes. ENT: No sore throat. Cardiovascular: Denies chest pain. Respiratory: Denies shortness of breath. Gastrointestinal: No abdominal pain positive nausea and vomiting Genitourinary: Negative for dysuria. Musculoskeletal: Negative for back pain. Skin: Negative for rash. Neurological: Negative for headaches, focal weakness or numbness. All other ROS negative ____________________________________________   PHYSICAL EXAM:  VITAL SIGNS: ED Triage Vitals  Enc Vitals Group     BP 01/19/21 1852 (!) 189/89     Pulse Rate 01/19/21 1849 73     Resp 01/19/21 1849 18  Temp 01/19/21 1852 98.3 F (36.8 C)     Temp Source 01/19/21 1852 Oral     SpO2 01/19/21 1849 99 %     Weight 01/19/21 1850 230 lb (104.3 kg)     Height 01/19/21 1850 5\' 11"  (1.803 m)     Head Circumference --      Peak Flow --      Pain Score 01/19/21 1850 0     Pain Loc --      Pain Edu? --      Excl. in [place]? --     Constitutional: Alert and oriented.  Sitting with his eyes closed. Eyes: Conjunctivae are normal. EOMI. Head: Atraumatic. Nose: No  congestion/rhinnorhea. Mouth/Throat: Mucous membranes are moist.   Neck: No stridor. Trachea Midline. FROM Cardiovascular: Normal rate, regular rhythm. Grossly normal heart sounds.  Good peripheral circulation. Respiratory: Normal respiratory effort.  No retractions. Lungs CTAB. Gastrointestinal: Soft and nontender. No distention. No abdominal bruits.  Positive active vomiting Musculoskeletal: No lower extremity tenderness nor edema.  No joint effusions. Neurologic:  Normal speech and language. No gross focal neurologic deficits are appreciated.  Skin:  Skin is warm, dry and intact. No rash noted. Psychiatric: Mood and affect are normal. Speech and behavior are normal. GU: Deferred   ____________________________________________   LABS (all labs ordered are listed, but only abnormal results are displayed)  Labs Reviewed  COMPREHENSIVE METABOLIC PANEL - Abnormal; Notable for the following components:      Result Value   Glucose, Bld 125 (*)    BUN 27 (*)    Creatinine, Ser 2.07 (*)    AST 13 (*)    GFR, Estimated 32 (*)    All other components within normal limits  CBC - Abnormal; Notable for the following components:   RBC 3.34 (*)    Hemoglobin 8.8 (*)    HCT 29.1 (*)    All other components within normal limits  FERRITIN - Abnormal; Notable for the following components:   Ferritin 9 (*)    All other components within normal limits  IRON AND TIBC - Abnormal; Notable for the following components:   Iron 19 (*)    Saturation Ratios 5 (*)    All other components within normal limits  RETICULOCYTES - Abnormal; Notable for the following components:   RBC. 3.43 (*)    Immature Retic Fract 21.9 (*)    All other components within normal limits  RESP PANEL BY RT-PCR (FLU A&B, COVID) ARPGX2  LIPASE, BLOOD  APTT  LACTATE DEHYDROGENASE  PROTIME-INR  URINALYSIS, COMPLETE (UACMP) WITH MICROSCOPIC  TYPE AND SCREEN  TYPE AND SCREEN    ____________________________________________   ED ECG REPORT I, Vanessa [place], the attending physician, personally viewed and interpreted this ECG.  Normal sinus rate of 75, no ST elevation, no T wave inversions, type I AV block ____________________________________________  RADIOLOGY Robert Bellow, personally viewed and evaluated these images (plain radiographs) as part of my medical decision making, as well as reviewing the written report by the radiologist.  ED MD interpretation: Nodule in the left upper lung  Official radiology report(s): DG Chest Portable 1 View  Result Date: 01/19/2021 CLINICAL DATA:  Shortness of breath.  Weakness and dizziness. EXAM: PORTABLE CHEST 1 VIEW COMPARISON:  03/14/2013, chest CT 01/16/2011 FINDINGS: Lung volumes are low. Upper normal heart size likely accentuated by low volume technique. Aortic atherosclerosis and tortuosity linear bibasilar opacities typical of atelectasis or scarring. Smoothly marginated 19 mm nodular density projects  over the left upper lung zone of the level of posterior fifth rib. No pneumothorax or large pleural effusion. No acute osseous abnormalities are seen. IMPRESSION: 1. Low lung volumes with bibasilar atelectasis or scarring. 2. Smoothly marginated 19 mm nodular density projects over the left upper lung zone. Left upper lobe pulmonary nodule previously measured 12 mm on 2012 CT. Given increase in size, recommend follow-up chest CT. This could be performed on an elective nonemergent basis. Electronically Signed   By: Keith Rake M.D.   On: 01/19/2021 19:58    ____________________________________________   PROCEDURES  Procedure(s) performed (including Critical Care):  .1-3 Lead EKG Interpretation Performed by: Vanessa [place], MD Authorized by: Vanessa [place], MD     Interpretation: normal     ECG rate:  70s    ECG rate assessment: normal     Rhythm: sinus rhythm     Ectopy: none     Conduction: normal     .Critical Care Performed by: Vanessa [place], MD Authorized by: Vanessa [place], MD   Critical care provider statement:    Critical care time (minutes):  35   Critical care was necessary to treat or prevent imminent or life-threatening deterioration of the following conditions:  Respiratory failure   Critical care was time spent personally by me on the following activities:  Discussions with consultants, evaluation of patient's response to treatment, examination of patient, ordering and performing treatments and interventions, ordering and review of laboratory studies, ordering and review of radiographic studies, pulse oximetry, re-evaluation of patient's condition, obtaining history from patient or surrogate and review of old charts     ____________________________________________   INITIAL IMPRESSION / [place] / ED COURSE  Gregory Ramos was evaluated in Emergency Department on 01/19/2021 for the symptoms described in the history of present illness. He was evaluated in the context of the global COVID-19 pandemic, which necessitated consideration that the patient might be at risk for infection with the SARS-CoV-2 virus that causes COVID-19. Institutional protocols and algorithms that pertain to the evaluation of patients at risk for COVID-19 are in a state of rapid change based on information released by regulatory bodies including the CDC and federal and state organizations. These policies and algorithms were followed during the patient's care in the ED.    Patient 80 year old who comes in with nausea, vomiting, dizziness.  Sudden onset nature I did consider the possibility of posterior stroke but patient is neuro intact with good finger-to-nose.  Patient would not be a great candidate for tPA given I am mostly concerned that this could be secondary to a GI bleed.  Patient does report a lot of rectal bleeding on my exam he does have bright red blood per rectum and grossly Hemoccult  positive.  His abdomen is soft and nontender and I have low suspicion for acute abdominal pathology at this time.  We will keep patient on the cardiac monitor in case this could be related to cardiac pathology and get an EKG.  We will give patient fluids and nausea meds and reassess.  Hemoglobin is low from baseline at 8.8 making my concern for GI bleed even higher now.  I reevaluate patient after the fluids and nausea meds and is feeling much better, 75%..  Added on Protonix for GI bleed.  Denies any risk factors such as alcohol or liver issues to suggest variceal bleed.  Suspect this is more likely a lower GI bleed again due to the bright red blood.  We will discussed with the hospital team for admission for GI bleed  After patient vomited patient's oxygen levels did drop to 88% so patient was placed on 2 L.  Chest x-ray was ordered that shows nodule is bigger than prior.  Discussed this with family so they are aware.  Put pulmonary nodule clinic referral in.  8:33 PM evaluated patient patient's feeling much better.       ____________________________________________   FINAL CLINICAL IMPRESSION(S) / ED DIAGNOSES   Final diagnoses:  Gastrointestinal hemorrhage, unspecified gastrointestinal hemorrhage type  Dizziness  Acute respiratory failure with hypoxia (HCC)      MEDICATIONS GIVEN DURING THIS VISIT:  Medications  pantoprazole (PROTONIX) 80 mg in sodium chloride 0.9 % 100 mL IVPB (has no administration in time range)  pantoprazole (PROTONIX) 80 mg in sodium chloride 0.9 % 100 mL (0.8 mg/mL) infusion (has no administration in time range)  pantoprazole (PROTONIX) injection 40 mg (has no administration in time range)  dextrose 5 % and 0.9 % NaCl with KCl 40 mEq/L infusion (has no administration in time range)  acetaminophen (TYLENOL) tablet 650 mg (has no administration in time range)    Or  acetaminophen (TYLENOL) suppository 650 mg (has no administration in time range)  morphine  2 MG/ML injection 2 mg (has no administration in time range)  ondansetron (ZOFRAN) tablet 4 mg (has no administration in time range)    Or  ondansetron (ZOFRAN) injection 4 mg (has no administration in time range)  sodium chloride 0.9 % bolus 1,000 mL (1,000 mLs Intravenous New Bag/Given 01/19/21 1920)  ondansetron (ZOFRAN) injection 4 mg (4 mg Intravenous Given 01/19/21 1912)     ED Discharge Orders    None       Note:  This document was prepared using Dragon voice recognition software and may include unintentional dictation errors.   Vanessa [place], MD 01/19/21 2105    Vanessa [place], MD 01/19/21 2133

## 2021-01-19 NOTE — ED Notes (Signed)
MD Funke at bedside to obtain ultrasound IV.

## 2021-01-19 NOTE — ED Triage Notes (Signed)
Pt states he started feeling dizzy about an hour ago- pt having weakness and vomiting as well- pt denies chest and abdominal pain

## 2021-01-19 NOTE — ED Notes (Signed)
Pt O2 saturation 88%, placed on 2L nasal cannula with improvements to 94%. MD Jari Pigg made aware.

## 2021-01-20 ENCOUNTER — Encounter: Payer: Self-pay | Admitting: Internal Medicine

## 2021-01-20 ENCOUNTER — Inpatient Hospital Stay (HOSPITAL_COMMUNITY)
Admit: 2021-01-20 | Discharge: 2021-01-20 | Disposition: A | Discharge status: Home / Self Care | Payer: Medicare Other | Attending: Medical | Admitting: Medical

## 2021-01-20 DIAGNOSIS — I340 Nonrheumatic mitral (valve) insufficiency: Secondary | ICD-10-CM

## 2021-01-20 DIAGNOSIS — I361 Nonrheumatic tricuspid (valve) insufficiency: Secondary | ICD-10-CM | POA: Diagnosis not present

## 2021-01-20 DIAGNOSIS — D649 Anemia, unspecified: Secondary | ICD-10-CM

## 2021-01-20 DIAGNOSIS — I10 Essential (primary) hypertension: Secondary | ICD-10-CM

## 2021-01-20 DIAGNOSIS — R9431 Abnormal electrocardiogram [ECG] [EKG]: Secondary | ICD-10-CM

## 2021-01-20 DIAGNOSIS — J9601 Acute respiratory failure with hypoxia: Secondary | ICD-10-CM

## 2021-01-20 DIAGNOSIS — R112 Nausea with vomiting, unspecified: Secondary | ICD-10-CM

## 2021-01-20 DIAGNOSIS — N183 Chronic kidney disease, stage 3 unspecified: Secondary | ICD-10-CM

## 2021-01-20 DIAGNOSIS — N1832 Chronic kidney disease, stage 3b: Secondary | ICD-10-CM

## 2021-01-20 LAB — FOLATE: Folate: 10.6 ng/mL (ref >5.9)

## 2021-01-20 LAB — HEMOGLOBIN
Hemoglobin: 8.3 g/dL — ABNORMAL LOW (ref 13.0–17.0)
Hemoglobin: 8 g/dL — ABNORMAL LOW (ref 13.0–17.0)

## 2021-01-20 LAB — ECHOCARDIOGRAM COMPLETE
AR max vel: 2.49 cm2
AV Peak grad: 11.3 mmHg
Ao pk vel: 1.68 m/s
Area-P 1/2: 4.29 cm2
Height: 71 in
S' Lateral: 3.22 cm
Weight: 3652.8 oz

## 2021-01-20 LAB — MAGNESIUM: Magnesium: 2.1 mg/dL (ref 1.7–2.4)

## 2021-01-20 LAB — VITAMIN B12: Vitamin B-12: 329 pg/mL (ref 180–914)

## 2021-01-20 LAB — TSH: TSH: 1.006 u[IU]/mL (ref 0.350–4.500)

## 2021-01-20 MED ORDER — LISINOPRIL 20 MG PO TABS
20.0000 mg | ORAL_TABLET | Freq: Every day | ORAL | Status: DC
  Administered 2021-01-21 – 2021-01-25 (×5): 20 mg via ORAL
  Filled 2021-01-20 (×5): qty 1

## 2021-01-20 MED ORDER — SENNOSIDES-DOCUSATE SODIUM 8.6-50 MG PO TABS
2.0000 | ORAL_TABLET | Freq: Two times a day (BID) | ORAL | Status: DC
  Administered 2021-01-20 – 2021-01-25 (×9): 2 via ORAL
  Filled 2021-01-20 (×7): qty 2

## 2021-01-20 MED ORDER — HYDROCODONE-ACETAMINOPHEN 5-325 MG PO TABS: 1.0000 | ORAL_TABLET | Freq: Four times a day (QID) | ORAL | Status: DC | PRN

## 2021-01-20 MED ORDER — SODIUM CHLORIDE 0.9 % IV SOLN
300.0000 mg | Freq: Once | INTRAVENOUS | Status: AC
  Administered 2021-01-20: 300 mg via INTRAVENOUS
  Filled 2021-01-20: qty 15

## 2021-01-20 MED ORDER — HYDROCHLOROTHIAZIDE 25 MG PO TABS
25.0000 mg | ORAL_TABLET | Freq: Every day | ORAL | Status: DC
  Administered 2021-01-21 – 2021-01-25 (×5): 25 mg via ORAL
  Filled 2021-01-20 (×5): qty 1

## 2021-01-20 MED ORDER — HYDRALAZINE HCL 20 MG/ML IJ SOLN: 5.0000 mg | Freq: Three times a day (TID) | INTRAMUSCULAR | Status: DC | PRN

## 2021-01-20 MED ORDER — LISINOPRIL-HYDROCHLOROTHIAZIDE 20-25 MG PO TABS: 1.0000 | ORAL_TABLET | Freq: Every day | ORAL | Status: DC

## 2021-01-20 MED ORDER — LACTULOSE 10 GM/15ML PO SOLN
20.0000 g | Freq: Once | ORAL | Status: AC
  Administered 2021-01-20: 20 g via ORAL
  Filled 2021-01-20 (×2): qty 30

## 2021-01-20 NOTE — Consult Note (Addendum)
Cardiology Consultation:   Patient ID: Gregory Ramos MRN: [id number]; DOB: 28-Feb-1941  Admit date: 01/19/2021 Date of Consult: 01/20/2021  PCP:  Marden Noble, MD   [place]  Cardiologist:  New Advanced Practice Provider:  No care team member to display Electrophysiologist:  None  [id number]   Patient Profile:   Gregory Ramos is a 80 y.o. male with a hx of HTN, L hip replacement, prostate surgery, and prediabetes who is being seen today for the evaluation of Mobitz type 2 AV block at the request of Dr. Roosevelt Locks.  History of Present Illness:   Gregory Ramos has not been seen by cardiology before. [facility] [place] [facility]. He denies history of MI, stent, CHF, arrhythmia, or stroke. No family history of heart disease. No tobacco, alcohol, drug use. Denies prior echo or stress test. He lives with his wife. Diet could be better, eats out too much. He has not worked since L hip replacement. Patient is functional and can perform ADLs. No chest pain with activity, has had some SOB recently.  The patient was on amlodipine 5mg , Metoprolol 50mg  BID, and HCTZ/lisinopril PTA. Says a couple months ago he was told to stop BB, but was unsure why. When he saw his PCP, BB was restarted.   Presented to the ER 01/19/21 for vomiting, lightheadedness, and melena. Said he had sudden onset headache, lightheadedness, dizziness. No abd pain. No fever or chills. Reported diarrhea with dark stools for a couple weeks. Also with SOB. NO chest pain. No LLE, orthopnea, or pnd.  In the ER he was hypoxic and placed on 2L O2. BP elevated 189/89, pulse 75, RR 26. Labs showed Hgb 8.8, creatinine 2.07, BUN 27, lipase 32, AST 13, ALT 9, optassium 3.8, sodium 141,  WBC 8.8 EKG showed bradycardia 47bpm, prolonged PRI with possible Mobitz type 2. CXR showed nodule recommending CT. The patient was placed on IV PPI and IVF and admitted.    Past Medical History:   Diagnosis Date  . Hyperlipidemia   . Hypertension   . Prostate enlargement   . Sleep apnea    resolved with weight loss    Past Surgical History:  Procedure Laterality Date  . COLONOSCOPY N/A 02/20/2015   Procedure: COLONOSCOPY;  Surgeon: Robert Bellow, MD;  Location: [facility] ENDOSCOPY;  Service: Endoscopy;  Laterality: N/A;  . CYST REMOVAL HAND  [id number]  . JOINT REPLACEMENT     hip  . PROSTATE BIOPSY  2000?  Marland Kitchen XI ROBOTIC ASSISTED SIMPLE PROSTATECTOMY N/A 01/14/2017   Procedure: XI ROBOTIC ASSISTED SIMPLE PROSTATECTOMY;  Surgeon: Alexis Frock, MD;  Location: [facility] ORS;  Service: Urology;  Laterality: N/A;     Home Medications:  Prior to Admission medications   Medication Sig Start Date End Date Taking? Authorizing Provider  ciprofloxacin (CIPRO) 500 MG tablet Take 1 tablet (500 mg total) by mouth 2 (two) times daily. Start day prior to office visit for foley removal 01/14/17   Debbrah Alar, PA-C  HYDROcodone-acetaminophen (NORCO) 5-325 MG tablet Take 1-2 tablets by mouth every 6 (six) hours as needed for moderate pain or severe pain. 01/14/17   Debbrah Alar, PA-C  lisinopril-hydrochlorothiazide (PRINZIDE,ZESTORETIC) 20-25 MG per tablet Take 1 tablet by mouth daily at 12 noon.  01/02/15   [provider]  senna (SENOKOT) 8.6 MG TABS tablet Take 1 tablet (8.6 mg total) by mouth 2 (two) times daily. While taking strong pain meds to prevent constipation. 01/15/17  Alexis Frock, MD    Inpatient Medications: Scheduled Meds: . [START ON 01/23/2021] pantoprazole  40 mg Intravenous Q12H   Continuous Infusions: . dextrose 5 % and 0.9 % NaCl with KCl 40 mEq/L 100 mL/hr at 01/20/21 0000  . pantoprozole (PROTONIX) infusion 8 mg/hr (01/19/21 2133)  . promethazine (PHENERGAN) injection (IM or IVPB)     PRN Meds: acetaminophen **OR** acetaminophen, morphine injection, ondansetron **OR** ondansetron (ZOFRAN) IV, promethazine (PHENERGAN) injection (IM or IVPB)  Allergies:   No Known  Allergies  Social History:   Social History   Socioeconomic History  . Marital status: Married    Spouse name: Not on file  . Number of children: Not on file  . Years of education: Not on file  . Highest education level: Not on file  Occupational History  . Not on file  Tobacco Use  . Smoking status: Former Smoker    Packs/day: 1.00    Years: 15.00    Pack years: 15.00    Types: Cigarettes  . Smokeless tobacco: Never Used  Substance and Sexual Activity  . Alcohol use: No    Alcohol/week: 0.0 standard drinks  . Drug use: No  . Sexual activity: Not on file  Other Topics Concern  . Not on file  Social History Narrative  . Not on file   Social Determinants of Health   Financial Resource Strain: Not on file  Food Insecurity: Not on file  Transportation Needs: Not on file  Physical Activity: Not on file  Stress: Not on file  Social Connections: Not on file  Intimate Partner Violence: Not on file    Family History:   History reviewed. No pertinent family history.   ROS:  Please see the history of present illness.   All other ROS reviewed and negative.     Physical Exam/Data:   Vitals:   01/20/21 0004 01/20/21 0120 01/20/21 0122 01/20/21 0400  BP: (!) 156/70 135/84 140/78 (!) 141/66  Pulse: (!) 51 (!) 35 (!) 56 (!) 58  Resp: 18 18 20 18   Temp: 98.2 F (36.8 C) 98.8 F (37.1 C) 98.7 F (37.1 C) 98.5 F (36.9 C)  TempSrc: Oral Oral Oral Oral  SpO2: 100% 100% 100% 100%  Weight:      Height:        Intake/Output Summary (Last 24 hours) at 01/20/2021 0725 Last data filed at 01/20/2021 0300 Gross per 24 hour  Intake 347.39 ml  Output --  Net 347.39 ml   Last 3 Weights 01/19/2021 01/19/2021 01/14/2017  Weight (lbs) 228 lb 4.8 oz 230 lb 247 lb  Weight (kg) 103.556 kg 104.327 kg 112.038 kg     Body mass index is 31.84 kg/m.  General:  Well nourished, well developed, in no acute distress HEENT: normal Lymph: no adenopathy Neck: no JVD Endocrine:  No  thryomegaly Vascular: No carotid bruits; FA pulses 2+ bilaterally without bruits  Cardiac:  normal S1, S2; RRR; no murmur  Lungs:  Diminished at bases Abd: soft, nontender, no hepatomegaly  Ext: no edema Musculoskeletal:  No deformities, BUE and BLE strength normal and equal Skin: warm and dry  Neuro:  CNs 2-12 intact, no focal abnormalities noted Psych:  Normal affect   EKG:  The EKG was personally reviewed and demonstrates: SB, 46bpm, Prolonged PRI, likely Mobitz type 2 block  Telemetry:  Telemetry was personally reviewed and demonstrates:  SB, first degree block and mobitz type 2, Hr 40-50s  Relevant CV Studies:  Echo ordered  Laboratory  Data:  High Sensitivity Troponin:  No results for input(s): TROPONINIHS in the last 720 hours.   Chemistry Recent Labs  Lab 01/19/21 1850  NA 141  K 3.8  CL 107  CO2 25  GLUCOSE 125*  BUN 27*  CREATININE 2.07*  CALCIUM 10.1  GFRNONAA 32*  ANIONGAP 9    Recent Labs  Lab 01/19/21 1850  PROT 6.8  ALBUMIN 3.5  AST 13*  ALT 9  ALKPHOS 68  BILITOT 0.6   Hematology Recent Labs  Lab 01/19/21 1850 01/20/21 0140  WBC 8.8  --   RBC 3.34*  3.43*  --   HGB 8.8* 8.3*  HCT 29.1*  --   MCV 87.1  --   MCH 26.3  --   MCHC 30.2  --   RDW 14.2  --   PLT 346  --    BNPNo results for input(s): BNP, PROBNP in the last 168 hours.  DDimer No results for input(s): DDIMER in the last 168 hours.   Radiology/Studies:  DG Chest Portable 1 View  Result Date: 01/19/2021 CLINICAL DATA:  Shortness of breath.  Weakness and dizziness. EXAM: PORTABLE CHEST 1 VIEW COMPARISON:  03/14/2013, chest CT 01/16/2011 FINDINGS: Lung volumes are low. Upper normal heart size likely accentuated by low volume technique. Aortic atherosclerosis and tortuosity linear bibasilar opacities typical of atelectasis or scarring. Smoothly marginated 19 mm nodular density projects over the left upper lung zone of the level of posterior fifth rib. No pneumothorax or large  pleural effusion. No acute osseous abnormalities are seen. IMPRESSION: 1. Low lung volumes with bibasilar atelectasis or scarring. 2. Smoothly marginated 19 mm nodular density projects over the left upper lung zone. Left upper lobe pulmonary nodule previously measured 12 mm on 2012 CT. Given increase in size, recommend follow-up chest CT. This could be performed on an elective nonemergent basis. Electronically Signed   By: Keith Rake M.D.   On: 01/19/2021 19:58     Assessment and Plan:   Acute Anemia Vomiting/Melena - presenting with vomiting, headache, dizziness and melena. He reported melena for the last few weeks with some SOB - Hgb 8.8. In December 2020 Hgb was 10 - NPO - IVF and IV protonix - GI consult  Mobitz Type 2 - EKG with likely Mobitz type 2 - Tele shows bradycardia with intermittent Mobitz type 2 - Patient was taking metoprolol 50mg  PTA>>hold this - Keep Mag>2 and K>4 - check TSH and Mag - will order Echo - Hold BB for 48 hour washout  CKD stage 3 - Scr relatively stable  HTN - severely elevated on admission - Home meds held - IV hydralazine  Hypoxia - IM monitoring for aspiration PNA - No signs of infection - Try and wean as able  For questions or updates, please contact [facility] Please consult [email or web address] for contact info under    Signed, Cadence Ninfa Meeker, PA-C  01/20/2021 7:25 AM   History and all data above reviewed.  Patient examined.  I agree with the findings as above.   The patient presented with weakness, dizziness, some dark stools with some red blood noted.  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  He has had a noted low heart rate once when he was getting vaccine and was actually taken off of his beta-blocker by the nurses at that site but this was restarted recently by his primary provider.  He has a history of hypertension that he says is relatively  well treated.  Although his med list from home does not include amlodipine and metoprolol he has the bottles with him and clearly has been on metoprolol.  He does not typically feel dizziness or lightheadedness other than this last day.  He has not had any presyncope or syncope.  He does get short of breath walking 50 yards on level ground to his mailbox but says this has been going on since he had hip surgery.  He denies PND or orthopnea.  The patient exam reveals COR: Regular rate and rhythm, soft apical systolic nonradiating murmur,  Lungs: Few basilar crackles,  Abd: Positive bowel sounds normal in frequency and pitch, Ext 2+ upper pulses with diminished dorsalis pedis and posterior tibialis bilateral.  All available labs, radiology testing, previous records reviewed. Agree with documented assessment and plan.  The patient does have first-degree AV block.  He has some nonconducted PACs.  He appears to have some Mobitz type I.  I am not convinced that he has Mobitz type II and I do not see any sustained high degree heart block.  There were no sustained bradycardia arrhythmias.  I did review all of the telemetry alarms.  I agree that he should not be on beta-blockers going forward and should avoid negative chronotropic meds.  I think he can be followed with an outpatient 2-week event monitor after he is discharged but I do not suspect that he will be headed towards requiring a pacemaker.  Note:  Echo prelim with NL EF.  Mild MR which we can follow clinically.  Gregory Ramos [facility]  11:33 AM  01/20/2021

## 2021-01-20 NOTE — Progress Notes (Signed)
PROGRESS NOTE    Gregory Ramos  BZJ:[id number] DOB: 1940/10/30 DOA: 01/19/2021 PCP: Marden Noble, MD   Chief complaint.  Nausea vomiting. Brief Narrative:  Gregory Ramos is a 80 y.o. male with medical history significant for HTN who presents to the emergency room with vomiting and dizziness that started a couple hours prior to presentation.  Emesis was clear without any coffee-ground material or bloody.  Patient also has complaining of some loose stools mixed with blood.  Patient has not had regular bowel movement for the last 2 weeks, he only has small amount loose stool every 3 days mixed with small amount of blood.  He did not have any abdominal pain. Upon arriving the hospital, he was found to have some mild anemia.  GI consult obtained.   Assessment & Plan:   Principal Problem:   Symptomatic anemia Active Problems:   Essential hypertension   Stage 3b chronic kidney disease (HCC)   Hematochezia   Hypoxia   Vomiting   Iron deficiency anemia due to chronic blood loss   Pulmonary nodule 1 cm or greater in diameter, left upper lobe  #1.  Symptomatic anemia. Iron deficient anemia. Bloody stool. Nausea vomiting secondary to constipation.  Patient has severe constipation, did not have a good bowel movement for the last 2 weeks.  Loose stools are small in amount, mixed with small amount of blood.  Condition more consistent with proctitis versus ulceration.  I do not believe the rectal bleeding was the source of anemia. Check iron, was a low, will give IV iron.  B12 level is pending. Appreciate GI consult. I will place him on scheduled senna, also give a dose of lactulose for constipation.  #2. Secondary degree AV block Mobitz type II. Patient has been followed by cardiology, no significant bradycardia today.  3.  Hypoxia. Patient does not have significant shortness of breath.  Currently on 2 L oxygen with 100% oxygen saturation, hypoxemia is improving.  #4.  Lung  nodule. Follow-up with PCP as outpatient.  5.  Chronic kidney disease stage IIIb. Stable.     DVT prophylaxis: SCDs Code Status: Full Family Communication:  Disposition Plan:  .   Status is: Inpatient  Remains inpatient appropriate because:Inpatient level of care appropriate due to severity of illness   Dispo: The patient is from: Home              Anticipated d/c is to: Home              Patient currently is not medically stable to d/c.   Difficult to place patient No        I/O last 3 completed shifts: In: 347.4 [I.V.:347.4] Out: -  Total I/O In: 360 [P.O.:360] Out: 64 [Urine:575]     Consultants:   GI, card  Procedures: None  Antimicrobials: None  Subjective: Patient doing well today, denies any short of breath.  On 2 L oxygen with saturation 100%. No abdominal pain.  No additional diarrhea today.  No nausea vomiting. No fever or chills. No dysuria hematuria.  Objective: Vitals:   01/20/21 0122 01/20/21 0400 01/20/21 0754 01/20/21 1134  BP: 140/78 (!) 141/66 140/68 131/78  Pulse: (!) 56 (!) 58 60 60  Resp: 20 18 17 17   Temp: 98.7 F (37.1 C) 98.5 F (36.9 C) 98.6 F (37 C) 98.6 F (37 C)  TempSrc: Oral Oral Oral   SpO2: 100% 100% 100% 100%  Weight:      Height:  Intake/Output Summary (Last 24 hours) at 01/20/2021 1341 Last data filed at 01/20/2021 1038 Gross per 24 hour  Intake 707.39 ml  Output 575 ml  Net 132.39 ml   Filed Weights   01/19/21 1850 01/19/21 2258  Weight: 104.3 kg 103.6 kg    Examination:  General exam: Appears calm and comfortable  Respiratory system: Clear to auscultation. Respiratory effort normal. Cardiovascular system: S1 & S2 heard, RRR. No JVD, murmurs, rubs, gallops or clicks. No pedal edema. Gastrointestinal system: Abdomen is nondistended, soft and nontender. No organomegaly or masses felt. Normal bowel sounds heard. Central nervous system: Alert and oriented. No focal neurological  deficits. Extremities: Symmetric 5 x 5 power. Skin: No rashes, lesions or ulcers Psychiatry: Judgement and insight appear normal. Mood & affect appropriate.     Data Reviewed: I have personally reviewed following labs and imaging studies  CBC: Recent Labs  Lab 01/19/21 1850 01/20/21 0140 01/20/21 0931  WBC 8.8  --   --   HGB 8.8* 8.3* 8.0*  HCT 29.1*  --   --   MCV 87.1  --   --   PLT 346  --   --    Basic Metabolic Panel: Recent Labs  Lab 01/19/21 1850 01/20/21 0931  NA 141  --   K 3.8  --   CL 107  --   CO2 25  --   GLUCOSE 125*  --   BUN 27*  --   CREATININE 2.07*  --   CALCIUM 10.1  --   MG  --  2.1   GFR: Estimated Creatinine Clearance: 36 mL/min (A) (by C-G formula based on SCr of 2.07 mg/dL (H)). Liver Function Tests: Recent Labs  Lab 01/19/21 1850  AST 13*  ALT 9  ALKPHOS 68  BILITOT 0.6  PROT 6.8  ALBUMIN 3.5   Recent Labs  Lab 01/19/21 1850  LIPASE 32   No results for input(s): AMMONIA in the last 168 hours. Coagulation Profile: Recent Labs  Lab 01/19/21 1850  INR 1.1   Cardiac Enzymes: No results for input(s): CKTOTAL, CKMB, CKMBINDEX, TROPONINI in the last 168 hours. BNP (last 3 results) No results for input(s): PROBNP in the last 8760 hours. HbA1C: No results for input(s): HGBA1C in the last 72 hours. CBG: No results for input(s): GLUCAP in the last 168 hours. Lipid Profile: No results for input(s): CHOL, HDL, LDLCALC, TRIG, CHOLHDL, LDLDIRECT in the last 72 hours. Thyroid Function Tests: Recent Labs    01/20/21 0931  TSH 1.006   Anemia Panel: Recent Labs    01/19/21 1850 01/20/21 1008  FOLATE  --  10.6  FERRITIN 9*  --   TIBC 396  --   IRON 19*  --   RETICCTPCT 2.1  --    Sepsis Labs: No results for input(s): PROCALCITON, LATICACIDVEN in the last 168 hours.  Recent Results (from the past 240 hour(s))  Resp Panel by RT-PCR (Flu A&B, Covid) Nasopharyngeal Swab     Status: None   Collection Time: 01/19/21  7:25 PM    Specimen: Nasopharyngeal Swab; Nasopharyngeal(NP) swabs in vial transport medium  Result Value Ref Range Status   SARS Coronavirus 2 by RT PCR NEGATIVE NEGATIVE Final    Comment: (NOTE) SARS-CoV-2 target nucleic acids are NOT DETECTED.  The SARS-CoV-2 RNA is generally detectable in upper respiratory specimens during the acute phase of infection. The lowest concentration of SARS-CoV-2 viral copies this assay can detect is 138 copies/mL. A negative result does not preclude SARS-Cov-2  infection and should not be used as the sole basis for treatment or other patient management decisions. A negative result may occur with  improper specimen collection/handling, submission of specimen other than nasopharyngeal swab, presence of viral mutation(s) within the areas targeted by this assay, and inadequate number of viral copies(<138 copies/mL). A negative result must be combined with clinical observations, patient history, and epidemiological information. The expected result is Negative.  Fact Sheet for Patients:  EntrepreneurPulse.com.au  Fact Sheet for Healthcare Providers:  IncredibleEmployment.be  This test is no t yet approved or cleared by the [place] FDA and  has been authorized for detection and/or diagnosis of SARS-CoV-2 by FDA under an Emergency Use Authorization (EUA). This EUA will remain  in effect (meaning this test can be used) for the duration of the COVID-19 declaration under Section 564(b)(1) of the Act, 21 U.S.C.section 360bbb-3(b)(1), unless the authorization is terminated  or revoked sooner.       Influenza A by PCR NEGATIVE NEGATIVE Final   Influenza B by PCR NEGATIVE NEGATIVE Final    Comment: (NOTE) The Xpert Xpress SARS-CoV-2/FLU/RSV plus assay is intended as an aid in the diagnosis of influenza from Nasopharyngeal swab specimens and should not be used as a sole basis for treatment. Nasal washings and aspirates are  unacceptable for Xpert Xpress SARS-CoV-2/FLU/RSV testing.  Fact Sheet for Patients: EntrepreneurPulse.com.au  Fact Sheet for Healthcare Providers: IncredibleEmployment.be  This test is not yet approved or cleared by the [place] FDA and has been authorized for detection and/or diagnosis of SARS-CoV-2 by FDA under an Emergency Use Authorization (EUA). This EUA will remain in effect (meaning this test can be used) for the duration of the COVID-19 declaration under Section 564(b)(1) of the Act, 21 U.S.C. section 360bbb-3(b)(1), unless the authorization is terminated or revoked.  Performed at [facility], [address], [place], [place] [address]          Radiology Studies: DG Chest Portable 1 View  Result Date: 01/19/2021 CLINICAL DATA:  Shortness of breath.  Weakness and dizziness. EXAM: PORTABLE CHEST 1 VIEW COMPARISON:  03/14/2013, chest CT 01/16/2011 FINDINGS: Lung volumes are low. Upper normal heart size likely accentuated by low volume technique. Aortic atherosclerosis and tortuosity linear bibasilar opacities typical of atelectasis or scarring. Smoothly marginated 19 mm nodular density projects over the left upper lung zone of the level of posterior fifth rib. No pneumothorax or large pleural effusion. No acute osseous abnormalities are seen. IMPRESSION: 1. Low lung volumes with bibasilar atelectasis or scarring. 2. Smoothly marginated 19 mm nodular density projects over the left upper lung zone. Left upper lobe pulmonary nodule previously measured 12 mm on 2012 CT. Given increase in size, recommend follow-up chest CT. This could be performed on an elective nonemergent basis. Electronically Signed   By: Keith Rake M.D.   On: 01/19/2021 19:58        Scheduled Meds: . lactulose  20 g Oral Once  . [START ON 01/23/2021] pantoprazole  40 mg Intravenous Q12H  . senna-docusate  2 tablet Oral BID   Continuous Infusions: .  iron sucrose    . pantoprozole (PROTONIX) infusion 8 mg/hr (01/20/21 0837)  . promethazine (PHENERGAN) injection (IM or IVPB)       LOS: 1 day    Time spent: 32 minutes    Sharen Hones, MD [facility]   To contact the attending provider between 7A-7P or the covering provider during after hours 7P-7A, please log into the web site [email or web address] and  access using universal [place] password for that web site. If you do not have the password, please call the hospital operator.  01/20/2021, 1:41 PM

## 2021-01-20 NOTE — Consult Note (Signed)
Gregory Ramos , MD [address] [place], [place], [place], [address] [address], [place], [place], [place], [address] Phone: [phone]  Fax: [phone]  Consultation  Referring Provider:     Dr Roosevelt Locks Primary Care Physician:  Marden Noble, MD Primary Gastroenterologist: None          Reason for Consultation:     GI bleed  Date of Admission:  01/19/2021 Date of Consultation:  01/20/2021         HPI:   Gregory Ramos is a 80 y.o. male presented to the hospital on 01/19/2021 with blood in the stool ongoing for a few weeks, bright red in color, on and off, no snaid or blood thinner use. 10 lbs weight loss last few months, no change in bowel habits, no abdominal pain. Also came in with cough ,shortness of breath on extertion , orthopnea but no PND last few days, lots of cough. Feels much better after coming in . No family history of colon cancer   Last colonoscopy was an 2016 performed by Dr. Bary Castilla and 2 subcentimeter polyps were noted which were resected and a 3 cm polyp was seen in the descending colon distal aspect which was partially resected.  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  Location was tattooed.  Unclear reason for not following up subsequently.  On admission he was noted to be hypoxic and given oxygen.  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  On admission hemoglobin 8.8 g with an MCV of 87.1 in 2020 his hemoglobin was 13.3 g.  Ferritin is low at 9.  INR 1.1.  The patient has been seen and evaluated by cardiology.  An echo has been ordered.  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  Past Medical History:  Diagnosis Date  . Hyperlipidemia   . Hypertension   . Prostate enlargement   . Sleep apnea    resolved with weight loss    Past Surgical History:  Procedure Laterality Date  . COLONOSCOPY N/A 02/20/2015   Procedure: COLONOSCOPY;   Surgeon: Robert Bellow, MD;  Location: [facility] ENDOSCOPY;  Service: Endoscopy;  Laterality: N/A;  . CYST REMOVAL HAND  [address]  . JOINT REPLACEMENT     hip  . PROSTATE BIOPSY  2000?  Marland Kitchen XI ROBOTIC ASSISTED SIMPLE PROSTATECTOMY N/A 01/14/2017   Procedure: XI ROBOTIC ASSISTED SIMPLE PROSTATECTOMY;  Surgeon: Alexis Frock, MD;  Location: [facility] ORS;  Service: Urology;  Laterality: N/A;    Prior to Admission medications   Medication Sig Start Date End Date Taking? Authorizing Provider  ciprofloxacin (CIPRO) 500 MG tablet Take 1 tablet (500 mg total) by mouth 2 (two) times daily. Start day prior to office visit for foley removal 01/14/17   Debbrah Alar, PA-C  HYDROcodone-acetaminophen (NORCO) 5-325 MG tablet Take 1-2 tablets by mouth every 6 (six) hours as needed for moderate pain or severe pain. 01/14/17   Debbrah Alar, PA-C  lisinopril-hydrochlorothiazide (PRINZIDE,ZESTORETIC) 20-25 MG per tablet Take 1 tablet by mouth daily at 12 noon.  01/02/15   [provider]  senna (SENOKOT) 8.6 MG TABS tablet Take 1 tablet (8.6 mg total) by mouth 2 (two) times daily. While taking strong pain meds to prevent constipation. 01/15/17   Alexis Frock, MD    History reviewed. No pertinent family history.   Social History   Tobacco Use  . Smoking status:  Former Smoker    Packs/day: 1.00    Years: 15.00    Pack years: 15.00    Types: Cigarettes  . Smokeless tobacco: Never Used  Substance Use Topics  . Alcohol use: No    Alcohol/week: 0.0 standard drinks  . Drug use: No    Allergies as of 01/19/2021  . (No Known Allergies)    Review of Systems:    All systems reviewed and negative except where noted in HPI.   Physical Exam:  Vital signs in last 24 hours: Temp:  [97.4 F (36.3 C)-98.8 F (37.1 C)] 98.6 F (37 C) (04/24 0754) Pulse Rate:  [35-75] 60 (04/24 0754) Resp:  [15-26] 17 (04/24 0754) BP: (135-189)/(66-99) 140/68 (04/24 0754) SpO2:  [88 %-100 %] 100 % (04/24 0754) Weight:  [103.6  kg-104.3 kg] 103.6 kg (04/23 2258) Last BM Date: 01/19/21 (at home per pt) General:   Pleasant, cooperative in NAD Head:  Normocephalic and atraumatic. Eyes:   No icterus.   Conjunctiva pink. PERRLA. Ears:  Normal auditory acuity. Neck:  Supple; no masses or thyroidomegaly Lungs: Respirations even and unlabored. Lungs clear to auscultation bilaterally.   No wheezes, crackles, or rhonchi.  Heart:  Regular rate and rhythm;  Without murmur, clicks, rubs or gallops Abdomen:  Soft, nondistended, nontender. Normal bowel sounds. No appreciable masses or hepatomegaly.  No rebound or guarding.  Neurologic:  Alert and oriented x3;  grossly normal neurologically. Psych:  Alert and cooperative. Normal affect.  LAB RESULTS: Recent Labs    01/19/21 1850 01/20/21 0140 01/20/21 0931  WBC 8.8  --   --   HGB 8.8* 8.3* 8.0*  HCT 29.1*  --   --   PLT 346  --   --    BMET Recent Labs    01/19/21 1850  NA 141  K 3.8  CL 107  CO2 25  GLUCOSE 125*  BUN 27*  CREATININE 2.07*  CALCIUM 10.1   LFT Recent Labs    01/19/21 1850  PROT 6.8  ALBUMIN 3.5  AST 13*  ALT 9  ALKPHOS 68  BILITOT 0.6   PT/INR Recent Labs    01/19/21 1850  LABPROT 14.5  INR 1.1    STUDIES: DG Chest Portable 1 View  Result Date: 01/19/2021 CLINICAL DATA:  Shortness of breath.  Weakness and dizziness. EXAM: PORTABLE CHEST 1 VIEW COMPARISON:  03/14/2013, chest CT 01/16/2011 FINDINGS: Lung volumes are low. Upper normal heart size likely accentuated by low volume technique. Aortic atherosclerosis and tortuosity linear bibasilar opacities typical of atelectasis or scarring. Smoothly marginated 19 mm nodular density projects over the left upper lung zone of the level of posterior fifth rib. No pneumothorax or large pleural effusion. No acute osseous abnormalities are seen. IMPRESSION: 1. Low lung volumes with bibasilar atelectasis or scarring. 2. Smoothly marginated 19 mm nodular density projects over the left upper lung  zone. Left upper lobe pulmonary nodule previously measured 12 mm on 2012 CT. Given increase in size, recommend follow-up chest CT. This could be performed on an elective nonemergent basis. Electronically Signed   By: Keith Rake M.D.   On: 01/19/2021 19:58      Impression / Plan:   DANTE ROUDEBUSH is a 80 y.o. y/o male whom I have been consulted for anemia and GI bleed.  The patient has iron deficiency anemia.  Last hemoglobin 2 years back was normal and admitted with hemoglobin of 8.8 and a very low ferritin.  The patient has last had a  colonoscopy in 2016 by Dr. 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  The location was tattooed.  It was recommended for him to return in 1 year. Located was tattooed .  Unclear reasons for not returning so.  It is very possible that the residual polyp has grown into either a large polyp or neoplasm considering the fact that he has iron deficiency anemia at this point of time.  He has at this point of time a intermittent Mobitz 2 heart block which has been seen and evaluated by cardiology.  Echo has been ordered.  Plan 1.  Monitor CBC and transfuse as needed.  If has acute bleeding consider tagged RBC scan or CT angiogram depending on creatinine.  2.  Once cardiology has cleared him to undergo a colonoscopy, we will prepare him for the same.  Hopefully in a few days.  3. Suggest dose of IV iron , check b12 and folate.   I have discussed alternative options, risks & benefits,  which include, but are not limited to, bleeding, infection, perforation,respiratory complication & drug reaction.  The patient agrees with this plan & written consent will be obtained.     Thank you for involving me in the care of this patient.      LOS: 1 day   Gregory Bellows, MD  01/20/2021, 9:54 AM

## 2021-01-20 NOTE — Progress Notes (Signed)
*  PRELIMINARY RESULTS* Echocardiogram 2D Echocardiogram has been performed.  Jonette Mate Taha Dimond 01/20/2021, 11:37 AM

## 2021-01-20 NOTE — Progress Notes (Signed)
   01/20/21 0120  Assess: MEWS Score  Temp 98.8 F (37.1 C)  BP 135/84  Pulse Rate (!) 35 (Pt in Mobitz I and II, HR flucuating between 35-60. 12 Ld EKG done. Pt with no c/o dizziness, nausea or pain. No bleeding from rectum upon arrival to floor. VSS and pt in NAD. Viewed previous EKG from 4/12 that was Mobitz 1. Notified charge/ NP)  ECG Heart Rate (!) 39 (HR bpm varies d/t rhythm changes, pt hemodynamically stable)  Resp 18  Level of Consciousness Alert  SpO2 100 %  O2 Device Nasal Cannula  Patient Activity (if Appropriate) In bed  O2 Flow Rate (L/min) 2 L/min  Assess: MEWS Score  MEWS Temp 0  MEWS Systolic 0  MEWS Pulse 2  MEWS RR 0  MEWS LOC 0  MEWS Score 2  MEWS Score Color Yellow  Assess: if the MEWS score is Yellow or Red  Were vital signs taken at a resting state? Yes  Focused Assessment Change from prior assessment (see assessment flowsheet)  Early Detection of Sepsis Score *See Row Information* Low  MEWS guidelines implemented *See Row Information* No, vital signs rechecked  Treat  MEWS Interventions Escalated (See documentation below)  Pain Scale 0-10  Pain Score 0  Take Vital Signs  Increase Vital Sign Frequency   (Continue to monitor HR on telemetry, strips saved in EMR)  Escalate  MEWS: Escalate Yellow: discuss with charge nurse/RN and consider discussing with provider and RRT  Notify: Charge Nurse/RN  Name of Charge Nurse/RN Notified Mericar RN  Date Charge Nurse/RN Notified 01/20/21  Time Charge Nurse/RN Notified 0125  Notify: Provider  Provider Name/Title B. Randol Kern NP, Dr. Damita Dunnings  Date Provider Notified 01/20/21  Time Provider Notified [phone]  Notification Type  (secure chat)  Notification Reason Change in status (Hey pt came up from ED. Suspected GIB. Pt with no c/o dizziness or nausea. Just noticed however pt is MOBITZ 1 and Mobitz 2 down to 35. EKGs obtained, VSS stable please advise)  Provider response At bedside;In department (Dr. Damita Dunnings  present at bedside. Updated on rhythms Mobitz i and Mobitz 2. HR varies 45-60's. Pt remains completely asymptomatic. VSS and NAD observed. Pt with no BM or blood loss observed since admit to floor. Urine yellow, clear.)  Date of Provider Response 01/20/21 (Dr. Damita Dunnings after bedside assessment will place cardiology consult. Continue to monitor pt for s/s of heart block/ bradycardia. Page if pt becomes symptomatic, unstable or VS change.)  Time of Provider Response [phone]  Document  Patient Outcome Other (Comment) (remains in unit, VSS, NAD observed. Pt is asymptomatic with Mobitz i and ii/ bradycardia)  Progress note created (see row info) Yes

## 2021-01-20 NOTE — Plan of Care (Signed)
New admission from ED. VSS, NAD. See assessments details.    Problem: Education: Goal: Knowledge of General Education information will improve Description: Including pain rating scale, medication(s)/side effects and non-pharmacologic comfort measures Outcome: Progressing   Problem: Health Behavior/Discharge Planning: Goal: Ability to manage health-related needs will improve Outcome: Progressing   Problem: Clinical Measurements: Goal: Ability to maintain clinical measurements within normal limits will improve Outcome: Progressing Goal: Will remain free from infection Outcome: Progressing Goal: Diagnostic test results will improve Outcome: Progressing Goal: Respiratory complications will improve Outcome: Progressing Goal: Cardiovascular complication will be avoided Outcome: Progressing   Problem: Activity: Goal: Risk for activity intolerance will decrease Outcome: Progressing   Problem: Nutrition: Goal: Adequate nutrition will be maintained Outcome: Progressing   Problem: Coping: Goal: Level of anxiety will decrease Outcome: Progressing   Problem: Elimination: Goal: Will not experience complications related to bowel motility Outcome: Progressing Goal: Will not experience complications related to urinary retention Outcome: Progressing   Problem: Pain Managment: Goal: General experience of comfort will improve Outcome: Progressing   Problem: Safety: Goal: Ability to remain free from injury will improve Outcome: Progressing   Problem: Skin Integrity: Goal: Risk for impaired skin integrity will decrease Outcome: Progressing   Problem: Education: Goal: Ability to identify signs and symptoms of gastrointestinal bleeding will improve Outcome: Progressing   Problem: Bowel/Gastric: Goal: Will show no signs and symptoms of gastrointestinal bleeding Outcome: Progressing   Problem: Fluid Volume: Goal: Will show no signs and symptoms of excessive bleeding Outcome:  Progressing   Problem: Clinical Measurements: Goal: Complications related to the disease process, condition or treatment will be avoided or minimized Outcome: Progressing

## 2021-01-21 DIAGNOSIS — D509 Iron deficiency anemia, unspecified: Secondary | ICD-10-CM

## 2021-01-21 LAB — CBC WITH DIFFERENTIAL/PLATELET
Abs Immature Granulocytes: 0.03 10*3/uL (ref 0.00–0.07)
Basophils Absolute: 0 10*3/uL (ref 0.0–0.1)
Basophils Relative: 1 %
Eosinophils Absolute: 0.1 10*3/uL (ref 0.0–0.5)
Eosinophils Relative: 2 %
HCT: 26.4 % — ABNORMAL LOW (ref 39.0–52.0)
Hemoglobin: 7.9 g/dL — ABNORMAL LOW (ref 13.0–17.0)
Immature Granulocytes: 1 %
Lymphocytes Relative: 24 %
Lymphs Abs: 1.5 10*3/uL (ref 0.7–4.0)
MCH: 26.2 pg (ref 26.0–34.0)
MCHC: 29.9 g/dL — ABNORMAL LOW (ref 30.0–36.0)
MCV: 87.4 fL (ref 80.0–100.0)
Monocytes Absolute: 0.6 10*3/uL (ref 0.1–1.0)
Monocytes Relative: 10 %
Neutro Abs: 4.1 10*3/uL (ref 1.7–7.7)
Neutrophils Relative %: 62 %
Platelets: 257 10*3/uL (ref 150–400)
RBC: 3.02 MIL/uL — ABNORMAL LOW (ref 4.22–5.81)
RDW: 14.4 % (ref 11.5–15.5)
WBC: 6.3 10*3/uL (ref 4.0–10.5)
nRBC: 0 % (ref 0.0–0.2)

## 2021-01-21 LAB — BASIC METABOLIC PANEL
Anion gap: 5 (ref 5–15)
BUN: 24 mg/dL — ABNORMAL HIGH (ref 8–23)
CO2: 28 mmol/L (ref 22–32)
Calcium: 9.4 mg/dL (ref 8.9–10.3)
Chloride: 107 mmol/L (ref 98–111)
Creatinine, Ser: 2.1 mg/dL — ABNORMAL HIGH (ref 0.61–1.24)
GFR, Estimated: 32 mL/min — ABNORMAL LOW (ref >60)
Glucose, Bld: 96 mg/dL (ref 70–99)
Potassium: 4 mmol/L (ref 3.5–5.1)
Sodium: 140 mmol/L (ref 135–145)

## 2021-01-21 LAB — MAGNESIUM: Magnesium: 2.1 mg/dL (ref 1.7–2.4)

## 2021-01-21 MED ORDER — PEG 3350-KCL-NA BICARB-NACL 420 G PO SOLR
4000.0000 mL | Freq: Once | ORAL | Status: AC
  Administered 2021-01-21: 4000 mL via ORAL
  Filled 2021-01-21: qty 4000

## 2021-01-21 MED ORDER — MAGNESIUM CITRATE PO SOLN
1.0000 | Freq: Once | ORAL | Status: DC
  Filled 2021-01-21: qty 296

## 2021-01-21 MED ORDER — MAGNESIUM CITRATE PO SOLN
1.0000 | Freq: Once | ORAL | Status: AC
  Administered 2021-01-21: 1 via ORAL
  Filled 2021-01-21: qty 296

## 2021-01-21 MED ORDER — SODIUM CHLORIDE 0.9 % IV SOLN: INTRAVENOUS | Status: DC

## 2021-01-21 MED ORDER — PEG 3350-KCL-NA BICARB-NACL 420 G PO SOLR
4000.0000 mL | Freq: Once | ORAL | Status: DC
  Filled 2021-01-21: qty 4000

## 2021-01-21 MED ORDER — SODIUM CHLORIDE 0.9 % IV SOLN
300.0000 mg | Freq: Once | INTRAVENOUS | Status: AC
  Administered 2021-01-22: 300 mg via INTRAVENOUS
  Filled 2021-01-21: qty 15

## 2021-01-21 NOTE — Progress Notes (Signed)
PROGRESS NOTE    Gregory Ramos  GMW:[id number] DOB: Nov 06, 1940 DOA: 01/19/2021 PCP: Marden Noble, MD   Chief complaint.  Nausea vomiting.  Brief Narrative:  Gregory Ramos is a 80 y.o. male with medical history significant for HTN who presents to the emergency room with vomiting and dizziness that started a couple hours prior to presentation.  Emesis was clear without any coffee-ground material or bloody.  Patient also has complaining of some loose stools mixed with blood.  Patient has not had regular bowel movement for the last 2 weeks, he only has small amount loose stool every 3 days mixed with small amount of blood.  He did not have any abdominal pain. Upon arriving the hospital, he was found to have some mild anemia.  GI consult obtained. Cardiology was consulted for clearance due to her Mobitz type I heart block, metoprolol was discontinued and cardiology cleared for colonoscopy.  Assessment & Plan:   Principal Problem:   Symptomatic anemia Active Problems:   Essential hypertension   Stage 3b chronic kidney disease (HCC)   Hematochezia   Hypoxia   Vomiting   Iron deficiency anemia due to chronic blood loss   Pulmonary nodule 1 cm or greater in diameter, left upper lobe  Symptomatic anemia./Iron deficient anemia./Bloody stool. Nausea vomiting secondary to constipation. Patient has severe constipation, did not have a good bowel movement for the last 2 weeks.  Loose stools are small in amount, mixed with small amount of blood.  Condition more consistent with proctitis versus ulceration.  Appreciate GI consult. -Continue with bowel regimen. -Keep him on clear liquids as he might go for colonoscopy tomorrow-waiting GI response -Monitor hemoglobin -Transfuse if below 7.  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 Cardiology cleared him for colonoscopy.  We will discontinue metoprolol. Patient has been followed by cardiology, no significant bradycardia today.   Hypoxia.   Resolved.  Now on room air  Lung nodule. Follow-up with PCP as outpatient.   Chronic kidney disease stage IIIb. Stable. -Monitor renal function. -Avoid nephrotoxins.  DVT prophylaxis: SCDs Code Status: Full Family Communication: Discussed with patient Disposition Plan:  .   Status is: Inpatient  Remains inpatient appropriate because:Inpatient level of care appropriate due to severity of illness   Dispo: The patient is from: Home              Anticipated d/c is to: Home              Patient currently is not medically stable to d/c.   Difficult to place patient No   I/O last 3 completed shifts: In: 1427.4 [P.O.:1080; I.V.:347.4] Out: 1125 [Urine:1125] Total I/O In: -  Out: 28 [Urine:575]  Consultants:   GI  Cardiology  Procedures: None  Antimicrobials: None  Subjective: Patient doing well today, denies any short of breath.  On 2 L oxygen with saturation 100%. No abdominal pain.  No additional diarrhea today.  No nausea vomiting. No fever or chills. No dysuria hematuria.  Objective: Vitals:   01/20/21 2030 01/21/21 0525 01/21/21 0807 01/21/21 1219  BP: 128/74 (!) 154/67 (!) 176/74 (!) 169/83  Pulse: 62 (!) 57 69 65  Resp: 18 18 18 18   Temp: 98.8 F (37.1 C) 98.8 F (37.1 C) 98.8 F (37.1 C) 98.6 F (37 C)  TempSrc: Oral Oral Oral Oral  SpO2: 97% 97% 96% 99%  Weight:      Height:        Intake/Output Summary (Last 24 hours) at  01/21/2021 1357 Last data filed at 01/21/2021 1217 Gross per 24 hour  Intake 480 ml  Output 825 ml  Net -345 ml   Filed Weights   01/19/21 1850 01/19/21 2258 01/20/21 0754  Weight: 104.3 kg 103.6 kg 102.9 kg    Examination:  General.  Elderly man, in no acute distress. Pulmonary.  Lungs clear bilaterally, normal respiratory effort. CV.  Regular rate and rhythm, no JVD, rub or murmur. Abdomen.  Soft, nontender, nondistended, BS positive. CNS.  Alert and oriented .  No focal neurologic deficit. Extremities.  No  edema, no cyanosis, pulses intact and symmetrical. Psychiatry.  Judgment and insight appears normal.   Data Reviewed: I have personally reviewed following labs and imaging studies  CBC: Recent Labs  Lab 01/19/21 1850 01/20/21 0140 01/20/21 0931 01/21/21 0527  WBC 8.8  --   --  6.3  NEUTROABS  --   --   --  4.1  HGB 8.8* 8.3* 8.0* 7.9*  HCT 29.1*  --   --  26.4*  MCV 87.1  --   --  87.4  PLT 346  --   --  810   Basic Metabolic Panel: Recent Labs  Lab 01/19/21 1850 01/20/21 0931 01/21/21 0527  NA 141  --  140  K 3.8  --  4.0  CL 107  --  107  CO2 25  --  28  GLUCOSE 125*  --  96  BUN 27*  --  24*  CREATININE 2.07*  --  2.10*  CALCIUM 10.1  --  9.4  MG  --  2.1 2.1   GFR: Estimated Creatinine Clearance: 35.4 mL/min (A) (by C-G formula based on SCr of 2.1 mg/dL (H)). Liver Function Tests: Recent Labs  Lab 01/19/21 1850  AST 13*  ALT 9  ALKPHOS 68  BILITOT 0.6  PROT 6.8  ALBUMIN 3.5   Recent Labs  Lab 01/19/21 1850  LIPASE 32   No results for input(s): AMMONIA in the last 168 hours. Coagulation Profile: Recent Labs  Lab 01/19/21 1850  INR 1.1   Cardiac Enzymes: No results for input(s): CKTOTAL, CKMB, CKMBINDEX, TROPONINI in the last 168 hours. BNP (last 3 results) No results for input(s): PROBNP in the last 8760 hours. HbA1C: No results for input(s): HGBA1C in the last 72 hours. CBG: No results for input(s): GLUCAP in the last 168 hours. Lipid Profile: No results for input(s): CHOL, HDL, LDLCALC, TRIG, CHOLHDL, LDLDIRECT in the last 72 hours. Thyroid Function Tests: Recent Labs    01/20/21 0931  TSH 1.006   Anemia Panel: Recent Labs    01/19/21 1850 01/20/21 1008  VITAMINB12  --  329  FOLATE  --  10.6  FERRITIN 9*  --   TIBC 396  --   IRON 19*  --   RETICCTPCT 2.1  --    Sepsis Labs: No results for input(s): PROCALCITON, LATICACIDVEN in the last 168 hours.  Recent Results (from the past 240 hour(s))  Resp Panel by RT-PCR (Flu  A&B, Covid) Nasopharyngeal Swab     Status: None   Collection Time: 01/19/21  7:25 PM   Specimen: Nasopharyngeal Swab; Nasopharyngeal(NP) swabs in vial transport medium  Result Value Ref Range Status   SARS Coronavirus 2 by RT PCR NEGATIVE NEGATIVE Final    Comment: (NOTE) SARS-CoV-2 target nucleic acids are NOT DETECTED.  The SARS-CoV-2 RNA is generally detectable in upper respiratory specimens during the acute phase of infection. The lowest concentration of SARS-CoV-2 viral copies this  assay can detect is 138 copies/mL. A negative result does not preclude SARS-Cov-2 infection and should not be used as the sole basis for treatment or other patient management decisions. A negative result may occur with  improper specimen collection/handling, submission of specimen other than nasopharyngeal swab, presence of viral mutation(s) within the areas targeted by this assay, and inadequate number of viral copies(<138 copies/mL). A negative result must be combined with clinical observations, patient history, and epidemiological information. The expected result is Negative.  Fact Sheet for Patients:  EntrepreneurPulse.com.au  Fact Sheet for Healthcare Providers:  IncredibleEmployment.be  This test is no t yet approved or cleared by the [place] FDA and  has been authorized for detection and/or diagnosis of SARS-CoV-2 by FDA under an Emergency Use Authorization (EUA). This EUA will remain  in effect (meaning this test can be used) for the duration of the COVID-19 declaration under Section 564(b)(1) of the Act, 21 U.S.C.section 360bbb-3(b)(1), unless the authorization is terminated  or revoked sooner.       Influenza A by PCR NEGATIVE NEGATIVE Final   Influenza B by PCR NEGATIVE NEGATIVE Final    Comment: (NOTE) The Xpert Xpress SARS-CoV-2/FLU/RSV plus assay is intended as an aid in the diagnosis of influenza from Nasopharyngeal swab specimens  and should not be used as a sole basis for treatment. Nasal washings and aspirates are unacceptable for Xpert Xpress SARS-CoV-2/FLU/RSV testing.  Fact Sheet for Patients: EntrepreneurPulse.com.au  Fact Sheet for Healthcare Providers: IncredibleEmployment.be  This test is not yet approved or cleared by the [place] FDA and has been authorized for detection and/or diagnosis of SARS-CoV-2 by FDA under an Emergency Use Authorization (EUA). This EUA will remain in effect (meaning this test can be used) for the duration of the COVID-19 declaration under Section 564(b)(1) of the Act, 21 U.S.C. section 360bbb-3(b)(1), unless the authorization is terminated or revoked.  Performed at [facility], [address] [place], [place] [address]      Radiology Studies: DG Chest Portable 1 View  Result Date: 01/19/2021 CLINICAL DATA:  Shortness of breath.  Weakness and dizziness. EXAM: PORTABLE CHEST 1 VIEW COMPARISON:  03/14/2013, chest CT 01/16/2011 FINDINGS: Lung volumes are low. Upper normal heart size likely accentuated by low volume technique. Aortic atherosclerosis and tortuosity linear bibasilar opacities typical of atelectasis or scarring. Smoothly marginated 19 mm nodular density projects over the left upper lung zone of the level of posterior fifth rib. No pneumothorax or large pleural effusion. No acute osseous abnormalities are seen. IMPRESSION: 1. Low lung volumes with bibasilar atelectasis or scarring. 2. Smoothly marginated 19 mm nodular density projects over the left upper lung zone. Left upper lobe pulmonary nodule previously measured 12 mm on 2012 CT. Given increase in size, recommend follow-up chest CT. This could be performed on an elective nonemergent basis. Electronically Signed   By: Keith Rake M.D.   On: 01/19/2021 19:58   ECHOCARDIOGRAM COMPLETE  Result Date: 01/20/2021    ECHOCARDIOGRAM REPORT   Patient Name:   HJALMAR BALLENGEE Date of Exam: 01/20/2021 Medical Rec #:  [id number]        Height:       71.0 in Accession #:    [id number]       Weight:       228.3 lb Date of Birth:  Mar 02, 1941       BSA:          2.231 m Patient Age:    64 years  BP:           189/89 mmHg Patient Gender: M                HR:           73 bpm. Exam Location:  [facility] Procedure: 2D Echo, Color Doppler and Cardiac Doppler Indications:     Abnormal ECG R94.31  History:         Patient has no prior history of Echocardiogram examinations.                  Risk Factors:Hypertension.  Sonographer:     Alyse Low Roar Referring Phys:  [id number] [place] Diagnosing Phys: Ida Rogue MD IMPRESSIONS  1. Left ventricular ejection fraction, by estimation, is 60 to 65%. The left ventricle has normal function. The left ventricle has no regional wall motion abnormalities. Left ventricular diastolic parameters are consistent with Grade I diastolic dysfunction (impaired relaxation).  2. Right ventricular systolic function is normal. The right ventricular size is normal. There is moderately elevated pulmonary artery systolic pressure. The estimated right ventricular systolic pressure is 40.0 mmHg.  3. Left atrial size was moderately dilated.  4. The mitral valve is normal in structure. Mild mitral valve regurgitation. FINDINGS  Left Ventricle: Left ventricular ejection fraction, by estimation, is 60 to 65%. The left ventricle has normal function. The left ventricle has no regional wall motion abnormalities. The left ventricular internal cavity size was normal in size. There is  no left ventricular hypertrophy. Left ventricular diastolic parameters are consistent with Grade I diastolic dysfunction (impaired relaxation). Right Ventricle: The right ventricular size is normal. No increase in right ventricular wall thickness. Right ventricular systolic function is normal. There is moderately elevated pulmonary artery systolic pressure. The tricuspid regurgitant velocity  is 3.42 m/s, and with an assumed right atrial pressure of 5 mmHg, the estimated right ventricular systolic pressure is 86.7 mmHg. Left Atrium: Left atrial size was moderately dilated. Right Atrium: Right atrial size was normal in size. Pericardium: There is no evidence of pericardial effusion. Mitral Valve: The mitral valve is normal in structure. Mild mitral valve regurgitation. No evidence of mitral valve stenosis. Tricuspid Valve: The tricuspid valve is normal in structure. Tricuspid valve regurgitation is mild . No evidence of tricuspid stenosis. Aortic Valve: The aortic valve is normal in structure. Aortic valve regurgitation is not visualized. No aortic stenosis is present. Aortic valve peak gradient measures 11.3 mmHg. Pulmonic Valve: The pulmonic valve was normal in structure. Pulmonic valve regurgitation is not visualized. No evidence of pulmonic stenosis. Aorta: The aortic root is normal in size and structure. Venous: The inferior vena cava is normal in size with greater than 50% respiratory variability, suggesting right atrial pressure of 3 mmHg. IAS/Shunts: No atrial level shunt detected by color flow Doppler.  LEFT VENTRICLE PLAX 2D LVIDd:         4.88 cm  Diastology LVIDs:         3.22 cm  LV e' medial:    5.66 cm/s LV PW:         1.09 cm  LV E/e' medial:  16.8 LV IVS:        1.36 cm  LV e' lateral:   7.29 cm/s LVOT diam:     2.20 cm  LV E/e' lateral: 13.0 LVOT Area:     3.80 cm  RIGHT VENTRICLE RV Mid diam:    3.39 cm RV S prime:     15.80 cm/s TAPSE (M-mode):  2.8 cm LEFT ATRIUM              Index       RIGHT ATRIUM           Index LA diam:        4.60 cm  2.06 cm/m  RA Area:     22.50 cm LA Vol (A2C):   96.4 ml  43.22 ml/m RA Volume:   71.00 ml  31.83 ml/m LA Vol (A4C):   117.0 ml 52.45 ml/m LA Biplane Vol: 107.0 ml 47.97 ml/m  AORTIC VALVE                PULMONIC VALVE AV Area (Vmax): 2.49 cm    PV Vmax:        0.78 m/s AV Vmax:        168.00 cm/s PV Peak grad:   2.5 mmHg AV Peak Grad:    11.3 mmHg   RVOT Peak grad: 1 mmHg LVOT Vmax:      110.00 cm/s  AORTA Ao Root diam: 3.40 cm MITRAL VALVE               TRICUSPID VALVE MV Area (PHT): 4.29 cm    TR Peak grad:   46.8 mmHg MV Decel Time: 177 msec    TR Vmax:        342.00 cm/s MV E velocity: 95.10 cm/s MV A velocity: 76.30 cm/s  SHUNTS MV E/A ratio:  1.25        Systemic Diam: 2.20 cm MV A Prime:    10.1 cm/s Ida Rogue MD Electronically signed by Ida Rogue MD Signature Date/Time: 01/20/2021/1:58:12 PM    Final     Scheduled Meds: . lisinopril  20 mg Oral Daily   And  . hydrochlorothiazide  25 mg Oral Daily  . [START ON 01/23/2021] pantoprazole  40 mg Intravenous Q12H  . senna-docusate  2 tablet Oral BID   Continuous Infusions: . pantoprozole (PROTONIX) infusion 8 mg/hr (01/20/21 2033)  . promethazine (PHENERGAN) injection (IM or IVPB)       LOS: 2 days    Time spent: 35 minutes  This record has been created using Systems analyst. Errors have been sought and corrected,but may not always be located. Such creation errors do not reflect on the standard of care.  Lorella Nimrod, MD [facility]   To contact the attending provider between 7A-7P or the covering provider during after hours 7P-7A, please log into the web site [email or web address] and access using universal [place] password for that web site. If you do not have the password, please call the hospital operator.  01/21/2021, 1:57 PM

## 2021-01-21 NOTE — Progress Notes (Signed)
Progress Note  Patient Name: Gregory Ramos Date of Encounter: 01/21/2021  Primary Cardiologist: [place]. [facility], Dr. Fletcher Anon rounding  Subjective   No chest pain.  Denies any dizziness, he has not been up to ambulate around the room on unit.  He has been using the bedside commode without any dizziness.  No shortness of breath.  He reports that he will follow-up with [facility] cardiology, rather than [facility], as [facility] is closer location.  Inpatient Medications    Scheduled Meds: . lisinopril  20 mg Oral Daily   And  . hydrochlorothiazide  25 mg Oral Daily  . [START ON 01/23/2021] pantoprazole  40 mg Intravenous Q12H  . senna-docusate  2 tablet Oral BID   Continuous Infusions: . pantoprozole (PROTONIX) infusion 8 mg/hr (01/20/21 2033)  . promethazine (PHENERGAN) injection (IM or IVPB)     PRN Meds: acetaminophen **OR** acetaminophen, hydrALAZINE, HYDROcodone-acetaminophen, morphine injection, ondansetron **OR** ondansetron (ZOFRAN) IV, promethazine (PHENERGAN) injection (IM or IVPB)   Vital Signs    Vitals:   01/20/21 1550 01/20/21 2030 01/21/21 0525 01/21/21 0807  BP: 120/71 128/74 (!) 154/67 (!) 176/74  Pulse: 70 62 (!) 57 69  Resp: 17 18 18 18   Temp: 98.5 F (36.9 C) 98.8 F (37.1 C) 98.8 F (37.1 C) 98.8 F (37.1 C)  TempSrc:  Oral Oral Oral  SpO2: 97% 97% 97% 96%  Weight:      Height:        Intake/Output Summary (Last 24 hours) at 01/21/2021 0926 Last data filed at 01/21/2021 4496 Gross per 24 hour  Intake 1080 ml  Output 800 ml  Net 280 ml   Last 3 Weights 01/20/2021 01/19/2021 01/19/2021  Weight (lbs) 226 lb 13.7 oz 228 lb 4.8 oz 230 lb  Weight (kg) 102.9 kg 103.556 kg 104.327 kg      Telemetry    NSR with 1st degree AVB and reported earlier Mobitz I. Most recent rates  Improved at 60-100s, PVCs, nonconducted PACs with 2.11 pause - Personally Revieweds  ECG    No new tracings. Earlier EKGs both with Mobitz type I and with first degree AVB, nonconducted PAC  - Personally Reviewed  Physical Exam   GEN: No acute distress.  Watching TV Neck: No JVD Cardiac: RRR, 1/6 systolic murmurs, rubs, or gallops.  Respiratory: Trace bibasilar crackles. GI: Soft, nontender, non-distended  MS: No edema; No deformity. Neuro:  Nonfocal  Psych: Normal affect   Labs    High Sensitivity Troponin:  No results for input(s): TROPONINIHS in the last 720 hours.    Chemistry Recent Labs  Lab 01/19/21 1850 01/21/21 0527  NA 141 140  K 3.8 4.0  CL 107 107  CO2 25 28  GLUCOSE 125* 96  BUN 27* 24*  CREATININE 2.07* 2.10*  CALCIUM 10.1 9.4  PROT 6.8  --   ALBUMIN 3.5  --   AST 13*  --   ALT 9  --   ALKPHOS 68  --   BILITOT 0.6  --   GFRNONAA 32* 32*  ANIONGAP 9 5     Hematology Recent Labs  Lab 01/19/21 1850 01/20/21 0140 01/20/21 0931 01/21/21 0527  WBC 8.8  --   --  6.3  RBC 3.34*  3.43*  --   --  3.02*  HGB 8.8* 8.3* 8.0* 7.9*  HCT 29.1*  --   --  26.4*  MCV 87.1  --   --  87.4  MCH 26.3  --   --  26.2  MCHC 30.2  --   --  29.9*  RDW 14.2  --   --  14.4  PLT 346  --   --  257    BNPNo results for input(s): BNP, PROBNP in the last 168 hours.   DDimer No results for input(s): DDIMER in the last 168 hours.   Radiology    DG Chest Portable 1 View  Result Date: 01/19/2021 CLINICAL DATA:  Shortness of breath.  Weakness and dizziness. EXAM: PORTABLE CHEST 1 VIEW COMPARISON:  03/14/2013, chest CT 01/16/2011 FINDINGS: Lung volumes are low. Upper normal heart size likely accentuated by low volume technique. Aortic atherosclerosis and tortuosity linear bibasilar opacities typical of atelectasis or scarring. Smoothly marginated 19 mm nodular density projects over the left upper lung zone of the level of posterior fifth rib. No pneumothorax or large pleural effusion. No acute osseous abnormalities are seen. IMPRESSION: 1. Low lung volumes with bibasilar atelectasis or scarring. 2. Smoothly marginated 19 mm nodular density projects over the left  upper lung zone. Left upper lobe pulmonary nodule previously measured 12 mm on 2012 CT. Given increase in size, recommend follow-up chest CT. This could be performed on an elective nonemergent basis. Electronically Signed   By: Keith Rake M.D.   On: 01/19/2021 19:58   ECHOCARDIOGRAM COMPLETE  Result Date: 01/20/2021    ECHOCARDIOGRAM REPORT   Patient Name:   [name] Date of Exam: 01/20/2021 Medical Rec #:  [id number]        Height:       71.0 in Accession #:    [id number]       Weight:       228.3 lb Date of Birth:  Oct 31, 1940       BSA:          2.231 m Patient Age:    80 years         BP:           189/89 mmHg Patient Gender: M                HR:           73 bpm. Exam Location:  [facility] Procedure: 2D Echo, Color Doppler and Cardiac Doppler Indications:     Abnormal ECG R94.31  History:         Patient has no prior history of Echocardiogram examinations.                  Risk Factors:Hypertension.  Sonographer:     Alyse Low Roar Referring Phys:  [id number] [place] Diagnosing Phys: Ida Rogue MD IMPRESSIONS  1. Left ventricular ejection fraction, by estimation, is 60 to 65%. The left ventricle has normal function. The left ventricle has no regional wall motion abnormalities. Left ventricular diastolic parameters are consistent with Grade I diastolic dysfunction (impaired relaxation).  2. Right ventricular systolic function is normal. The right ventricular size is normal. There is moderately elevated pulmonary artery systolic pressure. The estimated right ventricular systolic pressure is 21.1 mmHg.  3. Left atrial size was moderately dilated.  4. The mitral valve is normal in structure. Mild mitral valve regurgitation. FINDINGS  Left Ventricle: Left ventricular ejection fraction, by estimation, is 60 to 65%. The left ventricle has normal function. The left ventricle has no regional wall motion abnormalities. The left ventricular internal cavity size was normal in size. There is  no left  ventricular hypertrophy. Left ventricular diastolic parameters are consistent with Grade I diastolic dysfunction (impaired  relaxation). Right Ventricle: The right ventricular size is normal. No increase in right ventricular wall thickness. Right ventricular systolic function is normal. There is moderately elevated pulmonary artery systolic pressure. The tricuspid regurgitant velocity is 3.42 m/s, and with an assumed right atrial pressure of 5 mmHg, the estimated right ventricular systolic pressure is 54.6 mmHg. Left Atrium: Left atrial size was moderately dilated. Right Atrium: Right atrial size was normal in size. Pericardium: There is no evidence of pericardial effusion. Mitral Valve: The mitral valve is normal in structure. Mild mitral valve regurgitation. No evidence of mitral valve stenosis. Tricuspid Valve: The tricuspid valve is normal in structure. Tricuspid valve regurgitation is mild . No evidence of tricuspid stenosis. Aortic Valve: The aortic valve is normal in structure. Aortic valve regurgitation is not visualized. No aortic stenosis is present. Aortic valve peak gradient measures 11.3 mmHg. Pulmonic Valve: The pulmonic valve was normal in structure. Pulmonic valve regurgitation is not visualized. No evidence of pulmonic stenosis. Aorta: The aortic root is normal in size and structure. Venous: The inferior vena cava is normal in size with greater than 50% respiratory variability, suggesting right atrial pressure of 3 mmHg. IAS/Shunts: No atrial level shunt detected by color flow Doppler.  LEFT VENTRICLE PLAX 2D LVIDd:         4.88 cm  Diastology LVIDs:         3.22 cm  LV e' medial:    5.66 cm/s LV PW:         1.09 cm  LV E/e' medial:  16.8 LV IVS:        1.36 cm  LV e' lateral:   7.29 cm/s LVOT diam:     2.20 cm  LV E/e' lateral: 13.0 LVOT Area:     3.80 cm  RIGHT VENTRICLE RV Mid diam:    3.39 cm RV S prime:     15.80 cm/s TAPSE (M-mode): 2.8 cm LEFT ATRIUM              Index       RIGHT ATRIUM            Index LA diam:        4.60 cm  2.06 cm/m  RA Area:     22.50 cm LA Vol (A2C):   96.4 ml  43.22 ml/m RA Volume:   71.00 ml  31.83 ml/m LA Vol (A4C):   117.0 ml 52.45 ml/m LA Biplane Vol: 107.0 ml 47.97 ml/m  AORTIC VALVE                PULMONIC VALVE AV Area (Vmax): 2.49 cm    PV Vmax:        0.78 m/s AV Vmax:        168.00 cm/s PV Peak grad:   2.5 mmHg AV Peak Grad:   11.3 mmHg   RVOT Peak grad: 1 mmHg LVOT Vmax:      110.00 cm/s  AORTA Ao Root diam: 3.40 cm MITRAL VALVE               TRICUSPID VALVE MV Area (PHT): 4.29 cm    TR Peak grad:   46.8 mmHg MV Decel Time: 177 msec    TR Vmax:        342.00 cm/s MV E velocity: 95.10 cm/s MV A velocity: 76.30 cm/s  SHUNTS MV E/A ratio:  1.25        Systemic Diam: 2.20 cm MV A Prime:    10.1 cm/s [employer]  MD Electronically signed by Ida Rogue MD Signature Date/Time: 01/20/2021/1:58:12 PM    Final     Cardiac Studies   Echo 01/20/21 1. Left ventricular ejection fraction, by estimation, is 60 to 65%. The  left ventricle has normal function. The left ventricle has no regional  wall motion abnormalities. Left ventricular diastolic parameters are  consistent with Grade I diastolic  dysfunction (impaired relaxation).  2. Right ventricular systolic function is normal. The right ventricular  size is normal. There is moderately elevated pulmonary artery systolic  pressure. The estimated right ventricular systolic pressure is 02.5 mmHg.  3. Left atrial size was moderately dilated.  4. The mitral valve is normal in structure. Mild mitral valve  regurgitation.   Patient Profile     80 y.o. 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  Assessment & Plan    First degree AVB, Mobitz I -- Denies dizziness/presyncope.  Admitted in the setting of GI bleed with current evaluation by GI for melena and BRBPR.  Previous EKGs have shown first-degree AV block  with Mobitz type I.  No clear evidence of Mobitz type II noted on EKGs or telemetry.  Home medications included metoprolol, which has been discontinued.  Given his bradycardic rates at the time of presentation, it has been recommended that he avoid BB going forward.  Most recent telemetry rates are significantly improved from that of admission.  Echo as above with EF normal and NRWMA.  Recommend Zio XT x2 weeks at discharge for further evaluation.  Consider outpatient sleep study.  As above, he is agreeable to follow-up in the [place] office, as it is closer than the [place].  Diastolic dysfunction Pulmonary Hypertension -- At presentation, he reported some dyspnea but denies shortness of breath today.  Echo shows elevated RVSP at 51.8 mmHg and RAP 54mmHg. Also noted was moderate LAE and moderately elevated PASP. Continue to monitor volume status on currrent HCTZ - monitor renal function with daily BMET.  Continue to monitor pulmonary pressures going forward.  Monitor I's/O's, daily weights.  Consider OP sleep study.   Hypertension --BP not well controlled. Caution with current ACE and HCTZ given Cr.  Consider addition of amlodipine.  For questions or updates, please contact [place] Please consult [email or web address] for contact info under        Signed, Arvil Chaco, PA-C  01/21/2021, 9:26 AM

## 2021-01-21 NOTE — Progress Notes (Signed)
Gregory Darby, MD [address]  [place]  [place], [place] [address]  Main: [phone]  Fax: [phone] Pager: [phone]   Subjective: Patient denies any symptoms.  He denies any chest pain, lightheadedness, shortness of breath.  Patient is evaluated by cardiology for heart block.   Objective: Vital signs in last 24 hours: Vitals:   01/21/21 0525 01/21/21 0807 01/21/21 1219 01/21/21 1623  BP: (!) 154/67 (!) 176/74 (!) 169/83 (!) 168/81  Pulse: (!) 57 69 65 66  Resp: 18 18 18 18   Temp: 98.8 F (37.1 C) 98.8 F (37.1 C) 98.6 F (37 C) 98.7 F (37.1 C)  TempSrc: Oral Oral Oral Oral  SpO2: 97% 96% 99% 100%  Weight:      Height:       Weight change: -1.427 kg  Intake/Output Summary (Last 24 hours) at 01/21/2021 1910 Last data filed at 01/21/2021 1800 Gross per 24 hour  Intake 478.35 ml  Output 850 ml  Net -371.65 ml     Exam: Heart:: Bradycardia, regular rhythm Lungs: normal and clear to auscultation Abdomen: soft, nontender, normal bowel sounds   Lab Results: CBC Latest Ref Rng & Units 01/21/2021 01/20/2021 01/20/2021  WBC 4.0 - 10.5 K/uL 6.3 - -  Hemoglobin 13.0 - 17.0 g/dL 7.9(L) 8.0(L) 8.3(L)  Hematocrit 39.0 - 52.0 % 26.4(L) - -  Platelets 150 - 400 K/uL 257 - -   CMP Latest Ref Rng & Units 01/21/2021 01/19/2021 01/15/2017  Glucose 70 - 99 mg/dL 96 125(H) 146(H)  BUN 8 - 23 mg/dL 24(H) 27(H) 24(H)  Creatinine 0.61 - 1.24 mg/dL 2.10(H) 2.07(H) 1.85(H)  Sodium 135 - 145 mmol/L 140 141 138  Potassium 3.5 - 5.1 mmol/L 4.0 3.8 4.9  Chloride 98 - 111 mmol/L 107 107 104  CO2 22 - 32 mmol/L 28 25 29   Calcium 8.9 - 10.3 mg/dL 9.4 10.1 9.3  Total Protein 6.5 - 8.1 g/dL - 6.8 -  Total Bilirubin 0.3 - 1.2 mg/dL - 0.6 -  Alkaline Phos 38 - 126 U/L - 68 -  AST 15 - 41 U/L - 13(L) -  ALT 0 - 44 U/L - 9 -    Micro Results: Recent Results (from the past 240 hour(s))  Resp Panel by RT-PCR (Flu A&B, Covid) Nasopharyngeal Swab     Status: None    Collection Time: 01/19/21  7:25 PM   Specimen: Nasopharyngeal Swab; Nasopharyngeal(NP) swabs in vial transport medium  Result Value Ref Range Status   SARS Coronavirus 2 by RT PCR NEGATIVE NEGATIVE Final    Comment: (NOTE) SARS-CoV-2 target nucleic acids are NOT DETECTED.  The SARS-CoV-2 RNA is generally detectable in upper respiratory specimens during the acute phase of infection. The lowest concentration of SARS-CoV-2 viral copies this assay can detect is 138 copies/mL. A negative result does not preclude SARS-Cov-2 infection and should not be used as the sole basis for treatment or other patient management decisions. A negative result may occur with  improper specimen collection/handling, submission of specimen other than nasopharyngeal swab, presence of viral mutation(s) within the areas targeted by this assay, and inadequate number of viral copies(<138 copies/mL). A negative result must be combined with clinical observations, patient history, and epidemiological information. The expected result is Negative.  Fact Sheet for Patients:  EntrepreneurPulse.com.au  Fact Sheet for Healthcare Providers:  IncredibleEmployment.be  This test is no t yet approved or cleared by the [place] FDA and  has been authorized for detection and/or diagnosis of  SARS-CoV-2 by FDA under an Emergency Use Authorization (EUA). This EUA will remain  in effect (meaning this test can be used) for the duration of the COVID-19 declaration under Section 564(b)(1) of the Act, 21 U.S.C.section 360bbb-3(b)(1), unless the authorization is terminated  or revoked sooner.       Influenza A by PCR NEGATIVE NEGATIVE Final   Influenza B by PCR NEGATIVE NEGATIVE Final    Comment: (NOTE) The Xpert Xpress SARS-CoV-2/FLU/RSV plus assay is intended as an aid in the diagnosis of influenza from Nasopharyngeal swab specimens and should not be used as a sole basis for treatment.  Nasal washings and aspirates are unacceptable for Xpert Xpress SARS-CoV-2/FLU/RSV testing.  Fact Sheet for Patients: EntrepreneurPulse.com.au  Fact Sheet for Healthcare Providers: IncredibleEmployment.be  This test is not yet approved or cleared by the [place] FDA and has been authorized for detection and/or diagnosis of SARS-CoV-2 by FDA under an Emergency Use Authorization (EUA). This EUA will remain in effect (meaning this test can be used) for the duration of the COVID-19 declaration under Section 564(b)(1) of the Act, 21 U.S.C. section 360bbb-3(b)(1), unless the authorization is terminated or revoked.  Performed at [facility], [address] [place], [place] [address]    Studies/Results: DG Chest Portable 1 View  Result Date: 01/19/2021 CLINICAL DATA:  Shortness of breath.  Weakness and dizziness. EXAM: PORTABLE CHEST 1 VIEW COMPARISON:  03/14/2013, chest CT 01/16/2011 FINDINGS: Lung volumes are low. Upper normal heart size likely accentuated by low volume technique. Aortic atherosclerosis and tortuosity linear bibasilar opacities typical of atelectasis or scarring. Smoothly marginated 19 mm nodular density projects over the left upper lung zone of the level of posterior fifth rib. No pneumothorax or large pleural effusion. No acute osseous abnormalities are seen. IMPRESSION: 1. Low lung volumes with bibasilar atelectasis or scarring. 2. Smoothly marginated 19 mm nodular density projects over the left upper lung zone. Left upper lobe pulmonary nodule previously measured 12 mm on 2012 CT. Given increase in size, recommend follow-up chest CT. This could be performed on an elective nonemergent basis. Electronically Signed   By: Keith Rake M.D.   On: 01/19/2021 19:58   ECHOCARDIOGRAM COMPLETE  Result Date: 01/20/2021    ECHOCARDIOGRAM REPORT   Patient Name:   Gregory Ramos Date of Exam: 01/20/2021 Medical Rec #:  [id number]         Height:       71.0 in Accession #:    [id number]       Weight:       228.3 lb Date of Birth:  29-Mar-1941       BSA:          2.231 m Patient Age:    80 years         BP:           189/89 mmHg Patient Gender: M                HR:           73 bpm. Exam Location:  [facility] Procedure: 2D Echo, Color Doppler and Cardiac Doppler Indications:     Abnormal ECG R94.31  History:         Patient has no prior history of Echocardiogram examinations.                  Risk Factors:Hypertension.  Sonographer:     Alyse Low Roar Referring Phys:  [id number] Catharine Diagnosing Phys: Ida Rogue MD IMPRESSIONS  1. Left ventricular ejection fraction, by estimation, is 60 to 65%. The left ventricle has normal function. The left ventricle has no regional wall motion abnormalities. Left ventricular diastolic parameters are consistent with Grade I diastolic dysfunction (impaired relaxation).  2. Right ventricular systolic function is normal. The right ventricular size is normal. There is moderately elevated pulmonary artery systolic pressure. The estimated right ventricular systolic pressure is 17.5 mmHg.  3. Left atrial size was moderately dilated.  4. The mitral valve is normal in structure. Mild mitral valve regurgitation. FINDINGS  Left Ventricle: Left ventricular ejection fraction, by estimation, is 60 to 65%. The left ventricle has normal function. The left ventricle has no regional wall motion abnormalities. The left ventricular internal cavity size was normal in size. There is  no left ventricular hypertrophy. Left ventricular diastolic parameters are consistent with Grade I diastolic dysfunction (impaired relaxation). Right Ventricle: The right ventricular size is normal. No increase in right ventricular wall thickness. Right ventricular systolic function is normal. There is moderately elevated pulmonary artery systolic pressure. The tricuspid regurgitant velocity is 3.42 m/s, and with an assumed right atrial pressure of 5  mmHg, the estimated right ventricular systolic pressure is 10.2 mmHg. Left Atrium: Left atrial size was moderately dilated. Right Atrium: Right atrial size was normal in size. Pericardium: There is no evidence of pericardial effusion. Mitral Valve: The mitral valve is normal in structure. Mild mitral valve regurgitation. No evidence of mitral valve stenosis. Tricuspid Valve: The tricuspid valve is normal in structure. Tricuspid valve regurgitation is mild . No evidence of tricuspid stenosis. Aortic Valve: The aortic valve is normal in structure. Aortic valve regurgitation is not visualized. No aortic stenosis is present. Aortic valve peak gradient measures 11.3 mmHg. Pulmonic Valve: The pulmonic valve was normal in structure. Pulmonic valve regurgitation is not visualized. No evidence of pulmonic stenosis. Aorta: The aortic root is normal in size and structure. Venous: The inferior vena cava is normal in size with greater than 50% respiratory variability, suggesting right atrial pressure of 3 mmHg. IAS/Shunts: No atrial level shunt detected by color flow Doppler.  LEFT VENTRICLE PLAX 2D LVIDd:         4.88 cm  Diastology LVIDs:         3.22 cm  LV e' medial:    5.66 cm/s LV PW:         1.09 cm  LV E/e' medial:  16.8 LV IVS:        1.36 cm  LV e' lateral:   7.29 cm/s LVOT diam:     2.20 cm  LV E/e' lateral: 13.0 LVOT Area:     3.80 cm  RIGHT VENTRICLE RV Mid diam:    3.39 cm RV S prime:     15.80 cm/s TAPSE (M-mode): 2.8 cm LEFT ATRIUM              Index       RIGHT ATRIUM           Index LA diam:        4.60 cm  2.06 cm/m  RA Area:     22.50 cm LA Vol (A2C):   96.4 ml  43.22 ml/m RA Volume:   71.00 ml  31.83 ml/m LA Vol (A4C):   117.0 ml 52.45 ml/m LA Biplane Vol: 107.0 ml 47.97 ml/m  AORTIC VALVE                PULMONIC VALVE AV Area (Vmax): 2.49 cm  PV Vmax:        0.78 m/s AV Vmax:        168.00 cm/s PV Peak grad:   2.5 mmHg AV Peak Grad:   11.3 mmHg   RVOT Peak grad: 1 mmHg LVOT Vmax:      110.00 cm/s   AORTA Ao Root diam: 3.40 cm MITRAL VALVE               TRICUSPID VALVE MV Area (PHT): 4.29 cm    TR Peak grad:   46.8 mmHg MV Decel Time: 177 msec    TR Vmax:        342.00 cm/s MV E velocity: 95.10 cm/s MV A velocity: 76.30 cm/s  SHUNTS MV E/A ratio:  1.25        Systemic Diam: 2.20 cm MV A Prime:    10.1 cm/s Ida Rogue MD Electronically signed by Ida Rogue MD Signature Date/Time: 01/20/2021/1:58:12 PM    Final    Medications:  I have reviewed the patient's current medications. Prior to Admission:  Medications Prior to Admission  Medication Sig Dispense Refill Last Dose  . ciprofloxacin (CIPRO) 500 MG tablet Take 1 tablet (500 mg total) by mouth 2 (two) times daily. Start day prior to office visit for foley removal 6 tablet 0   . HYDROcodone-acetaminophen (NORCO) 5-325 MG tablet Take 1-2 tablets by mouth every 6 (six) hours as needed for moderate pain or severe pain. 30 tablet 0   . lisinopril-hydrochlorothiazide (PRINZIDE,ZESTORETIC) 20-25 MG per tablet Take 1 tablet by mouth daily at 12 noon.      . senna (SENOKOT) 8.6 MG TABS tablet Take 1 tablet (8.6 mg total) by mouth 2 (two) times daily. While taking strong pain meds to prevent constipation. 30 tablet 0    Scheduled: . lisinopril  20 mg Oral Daily   And  . hydrochlorothiazide  25 mg Oral Daily  . magnesium citrate  1 Bottle Oral Once  . [START ON 01/23/2021] pantoprazole  40 mg Intravenous Q12H  . polyethylene glycol-electrolytes  4,000 mL Oral Once  . senna-docusate  2 tablet Oral BID   Continuous: . sodium chloride 10 mL/hr at 01/21/21 1756  . [START ON 01/22/2021] iron sucrose    . pantoprozole (PROTONIX) infusion 8 mg/hr (01/20/21 2033)  . promethazine (PHENERGAN) injection (IM or IVPB)     AJO:INOMVEHMCNOBS **OR** acetaminophen, hydrALAZINE, HYDROcodone-acetaminophen, morphine injection, ondansetron **OR** ondansetron (ZOFRAN) IV, promethazine (PHENERGAN) injection (IM or IVPB) Anti-infectives (From admission, onward)    None     Scheduled Meds: . lisinopril  20 mg Oral Daily   And  . hydrochlorothiazide  25 mg Oral Daily  . magnesium citrate  1 Bottle Oral Once  . [START ON 01/23/2021] pantoprazole  40 mg Intravenous Q12H  . polyethylene glycol-electrolytes  4,000 mL Oral Once  . senna-docusate  2 tablet Oral BID   Continuous Infusions: . sodium chloride 10 mL/hr at 01/21/21 1756  . [START ON 01/22/2021] iron sucrose    . pantoprozole (PROTONIX) infusion 8 mg/hr (01/20/21 2033)  . promethazine (PHENERGAN) injection (IM or IVPB)     PRN Meds:.acetaminophen **OR** acetaminophen, hydrALAZINE, HYDROcodone-acetaminophen, morphine injection, ondansetron **OR** ondansetron (ZOFRAN) IV, promethazine (PHENERGAN) injection (IM or IVPB)   Assessment: Principal Problem:   Symptomatic anemia Active Problems:   Essential hypertension   Stage 3b chronic kidney disease (HCC)   Hematochezia   Hypoxia   Vomiting   Iron deficiency anemia due to chronic blood loss   Pulmonary nodule 1 cm  or greater in diameter, left upper lobe   Plan: Iron deficiency anemia: No evidence of active GI bleed Hemoglobin is overall stable Patient received IV iron on 4/24 Patient also presented with first-degree AV block and Mobitz 1 second-degree AV block, possible brief Mobitz type II AV block in setting of treatment with metoprolol twice daily.  He this was discontinued.  Since then, patient is in sinus rhythm with first-degree AV block.  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  Patient is deemed low risk to undergo endoscopic procedures from cardiac standpoint.  Appreciate cardiology recommendations  Recommend another dose of Venofer tomorrow Continue clear liquid diet Discussed with patient about upper endoscopy and colonoscopy tomorrow and he is agreeable, if his stools are clear we will do these procedures tomorrow, otherwise on Wednesday Bowel prep  ordered N.p.o. effective 5 AM  I have discussed alternative options, risks & benefits,  which include, but are not limited to, bleeding, infection, perforation,respiratory complication & drug reaction.  The patient agrees with this plan & written consent will be obtained.      LOS: 2 days   Catlyn Shipton 01/21/2021, 7:10 PM

## 2021-01-22 LAB — CBC
HCT: 28.9 % — ABNORMAL LOW (ref 39.0–52.0)
Hemoglobin: 8.8 g/dL — ABNORMAL LOW (ref 13.0–17.0)
MCH: 26.1 pg (ref 26.0–34.0)
MCHC: 30.4 g/dL (ref 30.0–36.0)
MCV: 85.8 fL (ref 80.0–100.0)
Platelets: 270 10*3/uL (ref 150–400)
RBC: 3.37 MIL/uL — ABNORMAL LOW (ref 4.22–5.81)
RDW: 14.3 % (ref 11.5–15.5)
WBC: 7.1 10*3/uL (ref 4.0–10.5)
nRBC: 0 % (ref 0.0–0.2)

## 2021-01-22 LAB — CELIAC DISEASE PANEL
Endomysial Ab, IgA: NEGATIVE
IgA: 107 mg/dL (ref 61–437)
Tissue Transglutaminase Ab, IgA: <2 U/mL (ref 0–3)

## 2021-01-22 LAB — HOMOCYSTEINE: Homocysteine: 11.5 umol/L (ref 0.0–19.2)

## 2021-01-22 MED ORDER — POLYETHYLENE GLYCOL 3350 17 GM/SCOOP PO POWD
1.0000 | Freq: Once | ORAL | Status: AC
  Administered 2021-01-22: 255 g via ORAL
  Filled 2021-01-22: qty 255

## 2021-01-22 MED ORDER — AMLODIPINE BESYLATE 5 MG PO TABS
2.5000 mg | ORAL_TABLET | Freq: Every day | ORAL | Status: DC
  Administered 2021-01-22: 2.5 mg via ORAL
  Filled 2021-01-22: qty 1

## 2021-01-22 NOTE — Progress Notes (Signed)
Progress Note  Patient Name: Gregory Ramos Date of Encounter: 01/22/2021  Primary Cardiologist: [facility]. [facility], Dr. Fletcher Anon rounding (wants to follow with [facility] going forward)  Subjective   No chest pain.  Denies any dizziness. No SOB.  Plan is now for EGD/colonoscopy.  Poor prep today --> Procedure now planned for tomorrow.  Reviewed the plan for Zio monitor before discharged. Explained the monitor to him.  Inpatient Medications    Scheduled Meds: . lisinopril  20 mg Oral Daily   And  . hydrochlorothiazide  25 mg Oral Daily  . [START ON 01/23/2021] pantoprazole  40 mg Intravenous Q12H  . senna-docusate  2 tablet Oral BID   Continuous Infusions: . sodium chloride 10 mL/hr at 01/21/21 1756  . pantoprozole (PROTONIX) infusion 8 mg/hr (01/20/21 2033)  . promethazine (PHENERGAN) injection (IM or IVPB)     PRN Meds: acetaminophen **OR** acetaminophen, hydrALAZINE, HYDROcodone-acetaminophen, morphine injection, ondansetron **OR** ondansetron (ZOFRAN) IV, promethazine (PHENERGAN) injection (IM or IVPB)   Vital Signs    Vitals:   01/21/21 1219 01/21/21 1623 01/21/21 2019 01/22/21 0804  BP: (!) 169/83 (!) 168/81 (!) 176/96 138/90  Pulse: 65 66 68 77  Resp: 18 18 18 18   Temp: 98.6 F (37 C) 98.7 F (37.1 C) 98.3 F (36.8 C) 98.6 F (37 C)  TempSrc: Oral Oral Oral Oral  SpO2: 99% 100% 100% 99%  Weight:      Height:        Intake/Output Summary (Last 24 hours) at 01/22/2021 1159 Last data filed at 01/22/2021 1018 Gross per 24 hour  Intake 1114.05 ml  Output 625 ml  Net 489.05 ml   Last 3 Weights 01/20/2021 01/19/2021 01/19/2021  Weight (lbs) 226 lb 13.7 oz 228 lb 4.8 oz 230 lb  Weight (kg) 102.9 kg 103.556 kg 104.327 kg      Telemetry    --SR with 1st degree AVB and occasional tachycardic rates.  --Rates today= 60s-120s --Earlier Mobitz type I (earlier in admission) and bradycardic rates into the high 30s-low 40s.  - Personally Reviewed ECG    No new  tracings.- Personally Reviewed  Physical Exam   GEN: No acute distress.  Watching TV Neck: No JVD Cardiac: RRR, 1/6 systolic murmurs, rubs, or gallops.  Respiratory: CTAB GI: Soft, nontender, non-distended  MS: No edema; No deformity. Neuro:  Nonfocal  Psych: Normal affect   Labs    High Sensitivity Troponin:  No results for input(s): TROPONINIHS in the last 720 hours.    Chemistry Recent Labs  Lab 01/19/21 1850 01/21/21 0527  NA 141 140  K 3.8 4.0  CL 107 107  CO2 25 28  GLUCOSE 125* 96  BUN 27* 24*  CREATININE 2.07* 2.10*  CALCIUM 10.1 9.4  PROT 6.8  --   ALBUMIN 3.5  --   AST 13*  --   ALT 9  --   ALKPHOS 68  --   BILITOT 0.6  --   GFRNONAA 32* 32*  ANIONGAP 9 5     Hematology Recent Labs  Lab 01/19/21 1850 01/20/21 0140 01/20/21 0931 01/21/21 0527 01/22/21 0914  WBC 8.8  --   --  6.3 7.1  RBC 3.34*  3.43*  --   --  3.02* 3.37*  HGB 8.8*   < > 8.0* 7.9* 8.8*  HCT 29.1*  --   --  26.4* 28.9*  MCV 87.1  --   --  87.4 85.8  MCH 26.3  --   --  26.2 26.1  MCHC 30.2  --   --  29.9* 30.4  RDW 14.2  --   --  14.4 14.3  PLT 346  --   --  257 270   < > = values in this interval not displayed.    BNPNo results for input(s): BNP, PROBNP in the last 168 hours.   DDimer No results for input(s): DDIMER in the last 168 hours.   Radiology    No results found.  Cardiac Studies   Echo 01/20/21 1. Left ventricular ejection fraction, by estimation, is 60 to 65%. The  left ventricle has normal function. The left ventricle has no regional  wall motion abnormalities. Left ventricular diastolic parameters are  consistent with Grade I diastolic  dysfunction (impaired relaxation).  2. Right ventricular systolic function is normal. The right ventricular  size is normal. There is moderately elevated pulmonary artery systolic  pressure. The estimated right ventricular systolic pressure is 27.2 mmHg.  3. Left atrial size was moderately dilated.  4. The mitral  valve is normal in structure. Mild mitral valve  regurgitation.   Patient Profile     80 y.o. 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  Assessment & Plan    First degree AVB, Mobitz I -- Denies dizziness/presyncope.  Admitted in the setting of GI bleed with current evaluation by GI for melena and BRBPR --> tentative plan for EGD/colonoscopy tomorrow.  Previous EKGs have shown first-degree AV block with Mobitz type I.  No clear evidence of Mobitz type II noted on EKGs or telemetry.  Home medications included metoprolol, which has been discontinued with improved rate. Do not restart metoprolol and recommended avoid BB going forward.  Low dose amlodipine started today for BP control and to be escalated as tolerated.  Echo as above with EF normal and NRWMA.  No indication for PPM at this time. Recommend Zio XT x2 weeks at discharge for further evaluation --> RT notified that monitor to be placed after EGD/colonoscopy tomorrow. Consider outpatient sleep study. He is agreeable to follow-up in the [place] office, as it is closer than the [place].   Diastolic dysfunction Pulmonary Hypertension, moderate -- At presentation, he reported some dyspnea but denies shortness of breath today.  Echo shows elevated RVSP at 51.8 mmHg and RAP 10mmHg. Also noted was moderate LAE and moderately elevated PASP. 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 Euvolemic today. Continue to monitor volume status on currrent lisinopril - HCTZ . Daily BMET --> will order BMET for today.  Monitor I's/O's, daily weights.  Consider OP sleep study.   Hypertension --BP not well controlled. Caution with current ACE and HCTZ given Cr and continue daily BMET.  Recommend addition of amlodipine --will restart low dose amlodipine 2.5mg  today to be escalated as tolerated.  Anemia --Daily CBC. Monitor per IM / GI for blood loss anemia. Plan is for EGD/colonoscopy  tomorrow (prep failed today-- will do tomorrow).   For questions or updates, please contact [facility] Please consult [email or web address] for contact info under        Signed, Arvil Chaco, PA-C  01/22/2021, 11:59 AM

## 2021-01-22 NOTE — Care Management Important Message (Signed)
Important Message  Patient Details  Name: Gregory Ramos MRN: [id number] Date of Birth: [birth year]   Medicare Important Message Given:  Yes     Dannette Barbara 01/22/2021, 10:55 AM

## 2021-01-22 NOTE — Progress Notes (Signed)
Patient drank only half the prep overnight.  Therefore, EGD and colonoscopy have been postponed until tomorrow Recommend to finish rest of the prep and if needed recommend more MiraLAX bowel prep today Continue clear liquids N.p.o. effective 5 AM tomorrow   Sherri Sear, MD

## 2021-01-22 NOTE — Progress Notes (Signed)
Mobility Specialist - Progress Note   01/22/21 1400  Mobility  Activity Ambulated in room  Level of Assistance Modified independent, requires aide device or extra time  Assistive Device None (IV POLE)  Distance Ambulated (ft) 75 ft  Mobility Response Tolerated well  Mobility performed by Mobility specialist  $Mobility charge 1 Mobility    Pre-mobility: 70 HR, 95% SpO2 During mobility: 94 HR, 98% SpO2 Post-mobility: 88 HR, 98% SpO2   Pt ambulated in room pushing IV pole. No LOB. ModI. Denied SOB on RA. Denied dizziness throughout session. Noted soiled sheets d/t spilled liquid, mobility provided clean linen. Pt denied exhaustion and fatigue from activity. Left sitting on BSC with needs in reach.    Kathee Delton Mobility Specialist 01/22/21, 2:25 PM

## 2021-01-22 NOTE — Plan of Care (Signed)

## 2021-01-22 NOTE — Progress Notes (Signed)
PROGRESS NOTE    Gregory Ramos  KKX:[id number] DOB: 20-Feb-1941 DOA: 01/19/2021 PCP: Gregory Noble, MD   Chief complaint.  Nausea vomiting.  Brief Narrative:  Gregory Ramos is a 80 y.o. male with medical history significant for HTN who presents to the emergency room with vomiting and dizziness that started a couple hours prior to presentation.  Emesis was clear without any coffee-ground material or bloody.  Patient also has complaining of some loose stools mixed with blood.  Patient has not had regular bowel movement for the last 2 weeks, he only has small amount loose stool every 3 days mixed with small amount of blood.  He did not have any abdominal pain. Upon arriving the hospital, he was found to have some mild anemia.  GI consult obtained. Cardiology was consulted for clearance due to her Mobitz type I heart block, metoprolol was discontinued and cardiology cleared for colonoscopy. Colonoscopy got postponed today due to incomplete prep.  Most likely will occur tomorrow, he will be n.p.o. after 5 AM tomorrow. Patient will need Zio monitor before discharge-cardiology is arranging it.  Assessment & Plan:   Principal Problem:   Symptomatic anemia Active Problems:   Essential hypertension   Stage 3b chronic kidney disease (HCC)   Hematochezia   Hypoxia   Vomiting   Iron deficiency anemia due to chronic blood loss   Pulmonary nodule 1 cm or greater in diameter, left upper lobe  Symptomatic anemia./Iron deficient anemia./Bloody stool. Nausea vomiting secondary to constipation. Patient has severe constipation, did not have a good bowel movement for the last 2 weeks.  Loose stools are small in amount, mixed with small amount of blood.  Condition more consistent with proctitis versus ulceration.  Appreciate GI consult-colonoscopy got postponed today due to incomplete prep.  Now will occur tomorrow.  Patient will remain on clear liquid. Hemoglobin seems improving. -Continue with  bowel regimen. -Monitor hemoglobin -Transfuse if below 7.  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 Cardiology cleared him for colonoscopy.  We will discontinue metoprolol. Patient has been followed by cardiology, no significant bradycardia today. -Patient will need cardiac monitor before discharge-cardiology is arranging it.   Hypoxia.  Resolved.  Now on room air  Lung nodule. Follow-up with PCP as outpatient.   Chronic kidney disease stage IIIb. Stable. -Monitor renal function. -Avoid nephrotoxins.  DVT prophylaxis: SCDs Code Status: Full Family Communication: Discussed with patient Disposition Plan:  .   Status is: Inpatient  Remains inpatient appropriate because:Inpatient level of care appropriate due to severity of illness   Dispo: The patient is from: Home              Anticipated d/c is to: Home              Patient currently is not medically stable to d/c.   Difficult to place patient No   I/O last 3 completed shifts: In: 478.4 [P.O.:120; I.V.:358.4] Out: 850 [Urine:850] Total I/O In: 1175.7 [P.O.:1080; IV Piggyback:95.7] Out: -   Consultants:   GI  Cardiology  Procedures: None  Antimicrobials: None  Subjective: Patient had no new complaint today.  Unable to finish his prep.  Denies any abdominal pain, nausea or vomiting.  Objective: Vitals:   01/21/21 1219 01/21/21 1623 01/21/21 2019 01/22/21 0804  BP: (!) 169/83 (!) 168/81 (!) 176/96 138/90  Pulse: 65 66 68 77  Resp: 18 18 18 18   Temp: 98.6 F (37 C) 98.7 F (37.1 C) 98.3 F (36.8 C) 98.6 F (  37 C)  TempSrc: Oral Oral Oral Oral  SpO2: 99% 100% 100% 99%  Weight:      Height:        Intake/Output Summary (Last 24 hours) at 01/22/2021 1420 Last data filed at 01/22/2021 1345 Gross per 24 hour  Intake 1534.05 ml  Output 275 ml  Net 1259.05 ml   Filed Weights   01/19/21 1850 01/19/21 2258 01/20/21 0754  Weight: 104.3 kg 103.6 kg 102.9 kg    Examination:  General.  Obese elderly man,  in no acute distress. Pulmonary.  Lungs clear bilaterally, normal respiratory effort. CV.  Regular rate and rhythm, no JVD, rub or murmur. Abdomen.  Soft, nontender, nondistended, BS positive. CNS.  Alert and oriented x3.  No focal neurologic deficit. Extremities.  No edema, no cyanosis, pulses intact and symmetrical. Psychiatry.  Judgment and insight appears normal.  Data Reviewed: I have personally reviewed following labs and imaging studies  CBC: Recent Labs  Lab 01/19/21 1850 01/20/21 0140 01/20/21 0931 01/21/21 0527 01/22/21 0914  WBC 8.8  --   --  6.3 7.1  NEUTROABS  --   --   --  4.1  --   HGB 8.8* 8.3* 8.0* 7.9* 8.8*  HCT 29.1*  --   --  26.4* 28.9*  MCV 87.1  --   --  87.4 85.8  PLT 346  --   --  257 683   Basic Metabolic Panel: Recent Labs  Lab 01/19/21 1850 01/20/21 0931 01/21/21 0527  NA 141  --  140  K 3.8  --  4.0  CL 107  --  107  CO2 25  --  28  GLUCOSE 125*  --  96  BUN 27*  --  24*  CREATININE 2.07*  --  2.10*  CALCIUM 10.1  --  9.4  MG  --  2.1 2.1   GFR: Estimated Creatinine Clearance: 35.4 mL/min (A) (by C-G formula based on SCr of 2.1 mg/dL (H)). Liver Function Tests: Recent Labs  Lab 01/19/21 1850  AST 13*  ALT 9  ALKPHOS 68  BILITOT 0.6  PROT 6.8  ALBUMIN 3.5   Recent Labs  Lab 01/19/21 1850  LIPASE 32   No results for input(s): AMMONIA in the last 168 hours. Coagulation Profile: Recent Labs  Lab 01/19/21 1850  INR 1.1   Cardiac Enzymes: No results for input(s): CKTOTAL, CKMB, CKMBINDEX, TROPONINI in the last 168 hours. BNP (last 3 results) No results for input(s): PROBNP in the last 8760 hours. HbA1C: No results for input(s): HGBA1C in the last 72 hours. CBG: No results for input(s): GLUCAP in the last 168 hours. Lipid Profile: No results for input(s): CHOL, HDL, LDLCALC, TRIG, CHOLHDL, LDLDIRECT in the last 72 hours. Thyroid Function Tests: Recent Labs    01/20/21 0931  TSH 1.006   Anemia Panel: Recent Labs     01/19/21 1850 01/20/21 1008  VITAMINB12  --  329  FOLATE  --  10.6  FERRITIN 9*  --   TIBC 396  --   IRON 19*  --   RETICCTPCT 2.1  --    Sepsis Labs: No results for input(s): PROCALCITON, LATICACIDVEN in the last 168 hours.  Recent Results (from the past 240 hour(s))  Resp Panel by RT-PCR (Flu A&B, Covid) Nasopharyngeal Swab     Status: None   Collection Time: 01/19/21  7:25 PM   Specimen: Nasopharyngeal Swab; Nasopharyngeal(NP) swabs in vial transport medium  Result Value Ref Range Status   SARS  Coronavirus 2 by RT PCR NEGATIVE NEGATIVE Final    Comment: (NOTE) SARS-CoV-2 target nucleic acids are NOT DETECTED.  The SARS-CoV-2 RNA is generally detectable in upper respiratory specimens during the acute phase of infection. The lowest concentration of SARS-CoV-2 viral copies this assay can detect is 138 copies/mL. A negative result does not preclude SARS-Cov-2 infection and should not be used as the sole basis for treatment or other patient management decisions. A negative result may occur with  improper specimen collection/handling, submission of specimen other than nasopharyngeal swab, presence of viral mutation(s) within the areas targeted by this assay, and inadequate number of viral copies(<138 copies/mL). A negative result must be combined with clinical observations, patient history, and epidemiological information. The expected result is Negative.  Fact Sheet for Patients:  EntrepreneurPulse.com.au  Fact Sheet for Healthcare Providers:  IncredibleEmployment.be  This test is no t yet approved or cleared by the [place] FDA and  has been authorized for detection and/or diagnosis of SARS-CoV-2 by FDA under an Emergency Use Authorization (EUA). This EUA will remain  in effect (meaning this test can be used) for the duration of the COVID-19 declaration under Section 564(b)(1) of the Act, 21 U.S.C.section 360bbb-3(b)(1), unless  the authorization is terminated  or revoked sooner.       Influenza A by PCR NEGATIVE NEGATIVE Final   Influenza B by PCR NEGATIVE NEGATIVE Final    Comment: (NOTE) The Xpert Xpress SARS-CoV-2/FLU/RSV plus assay is intended as an aid in the diagnosis of influenza from Nasopharyngeal swab specimens and should not be used as a sole basis for treatment. Nasal washings and aspirates are unacceptable for Xpert Xpress SARS-CoV-2/FLU/RSV testing.  Fact Sheet for Patients: EntrepreneurPulse.com.au  Fact Sheet for Healthcare Providers: IncredibleEmployment.be  This test is not yet approved or cleared by the [place] FDA and has been authorized for detection and/or diagnosis of SARS-CoV-2 by FDA under an Emergency Use Authorization (EUA). This EUA will remain in effect (meaning this test can be used) for the duration of the COVID-19 declaration under Section 564(b)(1) of the Act, 21 U.S.C. section 360bbb-3(b)(1), unless the authorization is terminated or revoked.  Performed at [facility], [address] [place], [place] [address]      Radiology Studies: No results found.  Scheduled Meds: . amLODipine  2.5 mg Oral Daily  . lisinopril  20 mg Oral Daily   And  . hydrochlorothiazide  25 mg Oral Daily  . [START ON 01/23/2021] pantoprazole  40 mg Intravenous Q12H  . polyethylene glycol powder  1 Container Oral Once  . senna-docusate  2 tablet Oral BID   Continuous Infusions: . sodium chloride 10 mL/hr at 01/21/21 1756  . promethazine (PHENERGAN) injection (IM or IVPB)       LOS: 3 days    Time spent: 30 minutes  This record has been created using Systems analyst. Errors have been sought and corrected,but may not always be located. Such creation errors do not reflect on the standard of care.  Lorella Nimrod, MD [facility]   To contact the attending provider between 7A-7P or the covering provider  during after hours 7P-7A, please log into the web site [email or web address] and access using universal [place] password for that web site. If you do not have the password, please call the hospital operator.  01/22/2021, 2:20 PM

## 2021-01-23 ENCOUNTER — Inpatient Hospital Stay: Payer: Medicare Other

## 2021-01-23 ENCOUNTER — Inpatient Hospital Stay: Payer: Medicare Other | Admitting: Anesthesiology

## 2021-01-23 ENCOUNTER — Encounter
Admission: EM | Disposition: A | Discharge status: Home / Self Care | Payer: Self-pay | Source: Home / Self Care | Attending: Internal Medicine

## 2021-01-23 ENCOUNTER — Encounter: Payer: Self-pay | Admitting: Internal Medicine

## 2021-01-23 ENCOUNTER — Other Ambulatory Visit: Payer: Self-pay | Admitting: Physician Assistant

## 2021-01-23 ENCOUNTER — Inpatient Hospital Stay (HOSPITAL_COMMUNITY)
Admit: 2021-01-23 | Discharge: 2021-01-23 | Disposition: A | Discharge status: Home / Self Care | Payer: Medicare Other | Attending: Physician Assistant | Admitting: Physician Assistant

## 2021-01-23 DIAGNOSIS — D500 Iron deficiency anemia secondary to blood loss (chronic): Secondary | ICD-10-CM

## 2021-01-23 DIAGNOSIS — K921 Melena: Secondary | ICD-10-CM

## 2021-01-23 DIAGNOSIS — R0902 Hypoxemia: Secondary | ICD-10-CM

## 2021-01-23 DIAGNOSIS — I441 Atrioventricular block, second degree: Secondary | ICD-10-CM

## 2021-01-23 DIAGNOSIS — K635 Polyp of colon: Secondary | ICD-10-CM

## 2021-01-23 DIAGNOSIS — K6389 Other specified diseases of intestine: Secondary | ICD-10-CM

## 2021-01-23 HISTORY — PX: COLONOSCOPY WITH PROPOFOL: SHX5780

## 2021-01-23 HISTORY — PX: ESOPHAGOGASTRODUODENOSCOPY (EGD) WITH PROPOFOL: SHX5813

## 2021-01-23 LAB — CBC WITH DIFFERENTIAL/PLATELET
Abs Immature Granulocytes: 0.05 10*3/uL (ref 0.00–0.07)
Basophils Absolute: 0 10*3/uL (ref 0.0–0.1)
Basophils Relative: 0 %
Eosinophils Absolute: 0 10*3/uL (ref 0.0–0.5)
Eosinophils Relative: 0 %
HCT: 30.5 % — ABNORMAL LOW (ref 39.0–52.0)
Hemoglobin: 9.2 g/dL — ABNORMAL LOW (ref 13.0–17.0)
Immature Granulocytes: 0 %
Lymphocytes Relative: 6 %
Lymphs Abs: 0.8 10*3/uL (ref 0.7–4.0)
MCH: 26.6 pg (ref 26.0–34.0)
MCHC: 30.2 g/dL (ref 30.0–36.0)
MCV: 88.2 fL (ref 80.0–100.0)
Monocytes Absolute: 0.7 10*3/uL (ref 0.1–1.0)
Monocytes Relative: 6 %
Neutro Abs: 10.9 10*3/uL — ABNORMAL HIGH (ref 1.7–7.7)
Neutrophils Relative %: 88 %
Platelets: 277 10*3/uL (ref 150–400)
RBC: 3.46 MIL/uL — ABNORMAL LOW (ref 4.22–5.81)
RDW: 14.6 % (ref 11.5–15.5)
WBC: 12.4 10*3/uL — ABNORMAL HIGH (ref 4.0–10.5)
nRBC: 0.2 % (ref 0.0–0.2)

## 2021-01-23 LAB — PROCALCITONIN: Procalcitonin: 0.17 ng/mL

## 2021-01-23 LAB — BASIC METABOLIC PANEL
Anion gap: 7 (ref 5–15)
BUN: 14 mg/dL (ref 8–23)
CO2: 26 mmol/L (ref 22–32)
Calcium: 9.8 mg/dL (ref 8.9–10.3)
Chloride: 105 mmol/L (ref 98–111)
Creatinine, Ser: 1.78 mg/dL — ABNORMAL HIGH (ref 0.61–1.24)
GFR, Estimated: 39 mL/min — ABNORMAL LOW (ref >60)
Glucose, Bld: 109 mg/dL — ABNORMAL HIGH (ref 70–99)
Potassium: 3.8 mmol/L (ref 3.5–5.1)
Sodium: 138 mmol/L (ref 135–145)

## 2021-01-23 LAB — BASIC METABOLIC PANEL WITH GFR
Anion gap: 8 (ref 5–15)
BUN: 13 mg/dL (ref 8–23)
CO2: 26 mmol/L (ref 22–32)
Calcium: 9.9 mg/dL (ref 8.9–10.3)
Chloride: 105 mmol/L (ref 98–111)
Creatinine, Ser: 1.8 mg/dL — ABNORMAL HIGH (ref 0.61–1.24)
GFR, Estimated: 38 mL/min — ABNORMAL LOW
Glucose, Bld: 95 mg/dL (ref 70–99)
Potassium: 4.4 mmol/L (ref 3.5–5.1)
Sodium: 139 mmol/L (ref 135–145)

## 2021-01-23 LAB — CBC
HCT: 25.8 % — ABNORMAL LOW (ref 39.0–52.0)
Hemoglobin: 7.9 g/dL — ABNORMAL LOW (ref 13.0–17.0)
MCH: 26.2 pg (ref 26.0–34.0)
MCHC: 30.6 g/dL (ref 30.0–36.0)
MCV: 85.4 fL (ref 80.0–100.0)
Platelets: 242 10*3/uL (ref 150–400)
RBC: 3.02 MIL/uL — ABNORMAL LOW (ref 4.22–5.81)
RDW: 14.4 % (ref 11.5–15.5)
WBC: 5.9 10*3/uL (ref 4.0–10.5)
nRBC: 0 % (ref 0.0–0.2)

## 2021-01-23 LAB — HEMOGLOBIN AND HEMATOCRIT, BLOOD
HCT: 27.1 % — ABNORMAL LOW (ref 39.0–52.0)
Hemoglobin: 8.2 g/dL — ABNORMAL LOW (ref 13.0–17.0)

## 2021-01-23 LAB — BRAIN NATRIURETIC PEPTIDE: B Natriuretic Peptide: 56.7 pg/mL (ref 0.0–100.0)

## 2021-01-23 LAB — LACTIC ACID, PLASMA
Lactic Acid, Venous: 3.7 mmol/L (ref 0.5–1.9)
Lactic Acid, Venous: 1.2 mmol/L (ref 0.5–1.9)

## 2021-01-23 SURGERY — ESOPHAGOGASTRODUODENOSCOPY (EGD) WITH PROPOFOL
Anesthesia: General

## 2021-01-23 MED ORDER — POTASSIUM CHLORIDE CRYS ER 20 MEQ PO TBCR
20.0000 meq | EXTENDED_RELEASE_TABLET | Freq: Once | ORAL | Status: AC
  Administered 2021-01-23: 20 meq via ORAL
  Filled 2021-01-23: qty 1

## 2021-01-23 MED ORDER — AMLODIPINE BESYLATE 5 MG PO TABS
2.5000 mg | ORAL_TABLET | Freq: Once | ORAL | Status: AC
  Administered 2021-01-23: 2.5 mg via ORAL
  Filled 2021-01-23: qty 1

## 2021-01-23 MED ORDER — PIPERACILLIN-TAZOBACTAM 3.375 G IVPB
3.3750 g | Freq: Three times a day (TID) | INTRAVENOUS | Status: DC
  Administered 2021-01-23 – 2021-01-25 (×5): 3.375 g via INTRAVENOUS
  Filled 2021-01-23 (×5): qty 50

## 2021-01-23 MED ORDER — SPOT INK MARKER SYRINGE KIT
PACK | SUBMUCOSAL | Status: DC | PRN
  Administered 2021-01-23: 4 mL via SUBMUCOSAL

## 2021-01-23 MED ORDER — AMLODIPINE BESYLATE 5 MG PO TABS
5.0000 mg | ORAL_TABLET | Freq: Every day | ORAL | Status: DC
  Administered 2021-01-24 – 2021-01-25 (×2): 5 mg via ORAL
  Filled 2021-01-23 (×2): qty 1

## 2021-01-23 MED ORDER — PROPOFOL 500 MG/50ML IV EMUL
INTRAVENOUS | Status: DC | PRN
  Administered 2021-01-23: 150 ug/kg/min via INTRAVENOUS

## 2021-01-23 MED ORDER — SODIUM CHLORIDE 0.9 % IV SOLN: INTRAVENOUS | Status: DC

## 2021-01-23 MED ORDER — SODIUM CHLORIDE 0.9% FLUSH
10.0000 mL | Freq: Two times a day (BID) | INTRAVENOUS | Status: DC
  Administered 2021-01-23 – 2021-01-25 (×4): 10 mL via INTRAVENOUS

## 2021-01-23 MED ORDER — SODIUM CHLORIDE 0.9 % IV SOLN
300.0000 mg | Freq: Once | INTRAVENOUS | Status: AC
  Administered 2021-01-23: 300 mg via INTRAVENOUS
  Filled 2021-01-23: qty 15

## 2021-01-23 MED ORDER — PROPOFOL 10 MG/ML IV BOLUS
INTRAVENOUS | Status: DC | PRN
  Administered 2021-01-23: 40 mg via INTRAVENOUS
  Administered 2021-01-23: 20 mg via INTRAVENOUS

## 2021-01-23 MED ORDER — SODIUM CHLORIDE 0.9 % IV BOLUS
500.0000 mL | Freq: Once | INTRAVENOUS | Status: AC
  Administered 2021-01-23: 500 mL via INTRAVENOUS

## 2021-01-23 MED ORDER — LIDOCAINE HCL (CARDIAC) PF 100 MG/5ML IV SOSY
PREFILLED_SYRINGE | INTRAVENOUS | Status: DC | PRN
  Administered 2021-01-23: 100 mg via INTRAVENOUS

## 2021-01-23 NOTE — Progress Notes (Signed)
Spoke with dr. Mal Misty regarding patients reaction post iron transfusion. Per md only order cxr, will assess and order determined he  Patient need further medication. Will continue to monitor closely

## 2021-01-23 NOTE — Progress Notes (Signed)
Cardiac monitor to be placed after EGD/colonoscopy and before discharge.

## 2021-01-23 NOTE — Progress Notes (Signed)
Progress Note    Gregory Ramos  BJS:[id number] DOB: [birth year]  DOA: 01/19/2021 PCP: Marden Noble, MD      Brief Narrative:    Medical records reviewed and are as summarized below:  Gregory Ramos is a 80 y.o. male       Assessment/Plan:   Principal Problem:   Symptomatic anemia Active Problems:   Essential hypertension   Stage 3b chronic kidney disease ([place])   Hematochezia   Hypoxia   Vomiting   Iron deficiency anemia due to chronic blood loss   Pulmonary nodule 1 cm or greater in diameter, left upper lobe   AV block, Mobitz 1   Body mass index is 31.42 kg/m.  (Obesity)  Symptomatic iron deficiency anemia, Hematochezia: S/p IV iron infusion.  He received IV iron infusion on 01/21/2021, 01/22/2021 today, 01/23/2021.  However, after completion of IV iron infusion today, patient developed chills, tachycardia, hypertension and mild hypoxia.  Patient has tolerated IV iron infusion in the past 2 days so it's unclear whether this is a transfusion reaction. Discharge has been canceled and he will be monitored in the hospital for another day.  Acute hypoxic respiratory failure: He is on 4 L/min oxygen via nasal cannula.  Taper off oxygen as able. Obtain stat chest x-ray, BNP, Procalcitonin, lactic acid,CBC and BMP.  Colon mass: He underwent EGD and colonoscopy today.  EGD was unremarkable.  Colonoscopy showed colon mass and polyps.  Mass was biopsied and polyps were removed.  Outpatient follow-up with oncologist for further evaluation.  First-degree AV block, Mobitz type I: Metoprolol has been discontinued.  Telemetry shows normal sinus rhythm.  Zio patch to be placed at discharge.  Outpatient follow-up with cardiologist.  CKD stage IIIb: Creatinine is stable.  Diet Order            Diet 2 gram sodium Room service appropriate? Yes; Fluid consistency: Thin  Diet effective now           Diet - low sodium heart healthy                     Consultants:  Cardiologist  Gastroenterologist  Procedures:  EGD and colonoscopy    Medications:   . [START ON 01/24/2021] amLODipine  5 mg Oral Daily  . lisinopril  20 mg Oral Daily   And  . hydrochlorothiazide  25 mg Oral Daily  . pantoprazole  40 mg Intravenous Q12H  . senna-docusate  2 tablet Oral BID   Continuous Infusions: . sodium chloride 10 mL/hr at 01/23/21 1121  . promethazine (PHENERGAN) injection (IM or IVPB)       Anti-infectives (From admission, onward)   None             Family Communication/Anticipated D/C date and plan/Code Status   DVT prophylaxis: SCDs Start: 01/19/21 2051     Code Status: Full Code  Family Communication: None Disposition Plan:    Status is: Inpatient  Remains inpatient appropriate because:Inpatient level of care appropriate due to severity of illness   Dispo: The patient is from: Home              Anticipated d/c is to: Home              Patient currently is not medically stable to d/c.   Difficult to place patient No           Subjective:   Interval events noted.  No bloody stools, abdominal  pain, shortness of breath or chest pain.  Plan was to discharge patient home today.  However, patient became tachycardic, hypoxic (88 to 89% on room air) and hypertensive after IV iron infusion was completed.  Objective:    Vitals:   01/23/21 1553 01/23/21 1602 01/23/21 1609 01/23/21 1620  BP: (!) 197/85 (!) 187/97 (!) 158/102 (!) 159/88  Pulse: 91 93 95 88  Resp: 20 20  (!) 22  Temp: 98.3 F (36.8 C)     TempSrc: Oral     SpO2: (!) 89% 97% 93% 99%  Weight:      Height:       No data found.   Intake/Output Summary (Last 24 hours) at 01/23/2021 1634 Last data filed at 01/23/2021 1228 Gross per 24 hour  Intake 1440 ml  Output 255 ml  Net 1185 ml   Filed Weights   01/19/21 2258 01/20/21 0754 01/23/21 0746  Weight: 103.6 kg 102.9 kg 102.2 kg    Exam:  GEN: NAD SKIN: No rash  EYES:  EOMI, PERRLA ENT: MMM no tongue, lip or facial swelling CV: RRR PULM: CTA B ABD: soft, ND, NT, +BS CNS: AAO x 3, non focal EXT: No edema or tenderness        Data Reviewed:   I have personally reviewed following labs and imaging studies:  Labs: Labs show the following:   Basic Metabolic Panel: Recent Labs  Lab 01/19/21 1850 01/20/21 0931 01/21/21 0527 01/23/21 0603  NA 141  --  140 138  K 3.8  --  4.0 3.8  CL 107  --  107 105  CO2 25  --  28 26  GLUCOSE 125*  --  96 109*  BUN 27*  --  24* 14  CREATININE 2.07*  --  2.10* 1.78*  CALCIUM 10.1  --  9.4 9.8  MG  --  2.1 2.1  --    GFR Estimated Creatinine Clearance: 41.7 mL/min (A) (by C-G formula based on SCr of 1.78 mg/dL (H)). Liver Function Tests: Recent Labs  Lab 01/19/21 1850  AST 13*  ALT 9  ALKPHOS 68  BILITOT 0.6  PROT 6.8  ALBUMIN 3.5   Recent Labs  Lab 01/19/21 1850  LIPASE 32   No results for input(s): AMMONIA in the last 168 hours. Coagulation profile Recent Labs  Lab 01/19/21 1850  INR 1.1    CBC: Recent Labs  Lab 01/19/21 1850 01/20/21 0140 01/20/21 0931 01/21/21 0527 01/22/21 0914 01/23/21 0603  WBC 8.8  --   --  6.3 7.1 5.9  NEUTROABS  --   --   --  4.1  --   --   HGB 8.8* 8.3* 8.0* 7.9* 8.8* 7.9*  HCT 29.1*  --   --  26.4* 28.9* 25.8*  MCV 87.1  --   --  87.4 85.8 85.4  PLT 346  --   --  257 270 242   Cardiac Enzymes: No results for input(s): CKTOTAL, CKMB, CKMBINDEX, TROPONINI in the last 168 hours. BNP (last 3 results) No results for input(s): PROBNP in the last 8760 hours. CBG: No results for input(s): GLUCAP in the last 168 hours. D-Dimer: No results for input(s): DDIMER in the last 72 hours. Hgb A1c: No results for input(s): HGBA1C in the last 72 hours. Lipid Profile: No results for input(s): CHOL, HDL, LDLCALC, TRIG, CHOLHDL, LDLDIRECT in the last 72 hours. Thyroid function studies: No results for input(s): TSH, T4TOTAL, T3FREE, THYROIDAB in the last 72  hours.  Invalid input(s): FREET3 Anemia work up: No results for input(s): VITAMINB12, FOLATE, FERRITIN, TIBC, IRON, RETICCTPCT in the last 72 hours. Sepsis Labs: Recent Labs  Lab 01/19/21 1850 01/21/21 0527 01/22/21 0914 01/23/21 0603  WBC 8.8 6.3 7.1 5.9    Microbiology Recent Results (from the past 240 hour(s))  Resp Panel by RT-PCR (Flu A&B, Covid) Nasopharyngeal Swab     Status: None   Collection Time: 01/19/21  7:25 PM   Specimen: Nasopharyngeal Swab; Nasopharyngeal(NP) swabs in vial transport medium  Result Value Ref Range Status   SARS Coronavirus 2 by RT PCR NEGATIVE NEGATIVE Final    Comment: (NOTE) SARS-CoV-2 target nucleic acids are NOT DETECTED.  The SARS-CoV-2 RNA is generally detectable in upper respiratory specimens during the acute phase of infection. The lowest concentration of SARS-CoV-2 viral copies this assay can detect is 138 copies/mL. A negative result does not preclude SARS-Cov-2 infection and should not be used as the sole basis for treatment or other patient management decisions. A negative result may occur with  improper specimen collection/handling, submission of specimen other than nasopharyngeal swab, presence of viral mutation(s) within the areas targeted by this assay, and inadequate number of viral copies(<138 copies/mL). A negative result must be combined with clinical observations, patient history, and epidemiological information. The expected result is Negative.  Fact Sheet for Patients:  EntrepreneurPulse.com.au  Fact Sheet for Healthcare Providers:  IncredibleEmployment.be  This test is no t yet approved or cleared by the [place] FDA and  has been authorized for detection and/or diagnosis of SARS-CoV-2 by FDA under an Emergency Use Authorization (EUA). This EUA will remain  in effect (meaning this test can be used) for the duration of the COVID-19 declaration under Section 564(b)(1) of the  Act, 21 U.S.C.section 360bbb-3(b)(1), unless the authorization is terminated  or revoked sooner.       Influenza A by PCR NEGATIVE NEGATIVE Final   Influenza B by PCR NEGATIVE NEGATIVE Final    Comment: (NOTE) The Xpert Xpress SARS-CoV-2/FLU/RSV plus assay is intended as an aid in the diagnosis of influenza from Nasopharyngeal swab specimens and should not be used as a sole basis for treatment. Nasal washings and aspirates are unacceptable for Xpert Xpress SARS-CoV-2/FLU/RSV testing.  Fact Sheet for Patients: EntrepreneurPulse.com.au  Fact Sheet for Healthcare Providers: IncredibleEmployment.be  This test is not yet approved or cleared by the [place] FDA and has been authorized for detection and/or diagnosis of SARS-CoV-2 by FDA under an Emergency Use Authorization (EUA). This EUA will remain in effect (meaning this test can be used) for the duration of the COVID-19 declaration under Section 564(b)(1) of the Act, 21 U.S.C. section 360bbb-3(b)(1), unless the authorization is terminated or revoked.  Performed at [facility], [address] [place], [place] [address]     Procedures and diagnostic studies:  No results found.             LOS: 4 days   Ahnna Dungan  [facility]   Pager on [email or web address]. If 7PM-7AM, please contact night-coverage at [email or web address]     01/23/2021, 4:34 PM

## 2021-01-23 NOTE — Progress Notes (Signed)
Progress Note  Patient Name: Gregory Ramos Date of Encounter: 01/23/2021  Primary Cardiologist: [place]. [facility], -wants to follow with [facility] going forward  Subjective   No chest pain.  Denies any dizziness. No SOB.   GI to perform EGD/colonoscopy today with plan for monitor placement after this procedure.  Inpatient Medications    Scheduled Meds: . amLODipine  2.5 mg Oral Daily  . lisinopril  20 mg Oral Daily   And  . hydrochlorothiazide  25 mg Oral Daily  . pantoprazole  40 mg Intravenous Q12H  . senna-docusate  2 tablet Oral BID   Continuous Infusions: . sodium chloride 10 mL/hr at 01/21/21 1756  . promethazine (PHENERGAN) injection (IM or IVPB)     PRN Meds: acetaminophen **OR** acetaminophen, hydrALAZINE, HYDROcodone-acetaminophen, morphine injection, ondansetron **OR** ondansetron (ZOFRAN) IV, promethazine (PHENERGAN) injection (IM or IVPB)   Vital Signs    Vitals:   01/22/21 2200 01/23/21 0536 01/23/21 0745 01/23/21 0746  BP: 136/74 138/84 (!) 152/86   Pulse: 71 70 64   Resp: (!) 24 18 18    Temp: 98.1 F (36.7 C) 97.9 F (36.6 C) 98 F (36.7 C)   TempSrc: Oral Oral Oral   SpO2: 100% 100% 100%   Weight:    102.2 kg  Height:        Intake/Output Summary (Last 24 hours) at 01/23/2021 0906 Last data filed at 01/23/2021 0600 Gross per 24 hour  Intake 2215.7 ml  Output 255 ml  Net 1960.7 ml   Last 3 Weights 01/23/2021 01/20/2021 01/19/2021  Weight (lbs) 225 lb 5 oz 226 lb 13.7 oz 228 lb 4.8 oz  Weight (kg) 102.2 kg 102.9 kg 103.556 kg      Telemetry    --SR with 1st degree AVB and occasional tachycardic rates.  --Rates today= 60s-120s --Earlier Mobitz type I (earlier in admission) and bradycardic rates into the high 30s-low 40s.  - Personally Reviewed ECG    No new tracings.- Personally Reviewed  Physical Exam   GEN: No acute distress.  Watching TV Neck: No JVD Cardiac: RRR, 1/6 systolic murmurs, rubs, or gallops.  Respiratory: CTAB GI:  Soft, nontender, non-distended  MS: No edema; No deformity. Neuro:  Nonfocal  Psych: Normal affect   Labs    High Sensitivity Troponin:  No results for input(s): TROPONINIHS in the last 720 hours.    Chemistry Recent Labs  Lab 01/19/21 1850 01/21/21 0527 01/23/21 0603  NA 141 140 138  K 3.8 4.0 3.8  CL 107 107 105  CO2 25 28 26   GLUCOSE 125* 96 109*  BUN 27* 24* 14  CREATININE 2.07* 2.10* 1.78*  CALCIUM 10.1 9.4 9.8  PROT 6.8  --   --   ALBUMIN 3.5  --   --   AST 13*  --   --   ALT 9  --   --   ALKPHOS 68  --   --   BILITOT 0.6  --   --   GFRNONAA 32* 32* 39*  ANIONGAP 9 5 7      Hematology Recent Labs  Lab 01/21/21 0527 01/22/21 0914 01/23/21 0603  WBC 6.3 7.1 5.9  RBC 3.02* 3.37* 3.02*  HGB 7.9* 8.8* 7.9*  HCT 26.4* 28.9* 25.8*  MCV 87.4 85.8 85.4  MCH 26.2 26.1 26.2  MCHC 29.9* 30.4 30.6  RDW 14.4 14.3 14.4  PLT 257 270 242    BNPNo results for input(s): BNP, PROBNP in the last 168 hours.   DDimer  No results for input(s): DDIMER in the last 168 hours.   Radiology    No results found.  Cardiac Studies   Echo 01/20/21 1. Left ventricular ejection fraction, by estimation, is 60 to 65%. The  left ventricle has normal function. The left ventricle has no regional  wall motion abnormalities. Left ventricular diastolic parameters are  consistent with Grade I diastolic  dysfunction (impaired relaxation).  2. Right ventricular systolic function is normal. The right ventricular  size is normal. There is moderately elevated pulmonary artery systolic  pressure. The estimated right ventricular systolic pressure is 35.3 mmHg.  3. Left atrial size was moderately dilated.  4. The mitral valve is normal in structure. Mild mitral valve  regurgitation.   Patient Profile     80 y.o. 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  Assessment & Plan    First degree AVB,  Mobitz I -- Denies dizziness/presyncope.  Admitted in the setting of GI bleed with current evaluation by GI for melena and BRBPR --> tentative plan for EGD/colonoscopy tomorrow.  Previous EKGs have shown first-degree AV block with Mobitz type I.  No clear evidence of Mobitz type II noted on EKGs or telemetry.  Home medications included metoprolol, which has been discontinued with improved rate. Do not restart metoprolol and recommended avoid BB going forward.  Increased amlodipine for BP support today with additional 2.5mg  amlodipine ordered today.  Echo as above with EF normal and NRWMA.  No indication for PPM at this time. Recommend Zio XT x2 weeks at discharge for further evaluation --> RT notified that monitor to be placed after EGD/colonoscopy today. Consider outpatient sleep study. He is agreeable to follow-up in the [place] office, as it is closer than the [place].   Diastolic dysfunction Pulmonary Hypertension, moderate -- At presentation, he reported some dyspnea but denies shortness of breath today.  Echo shows elevated RVSP at 51.8 mmHg and RAP 49mmHg. Also noted was moderate LAE and moderately elevated PASP. 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 Euvolemic today. Continue to monitor volume status on currrent lisinopril - HCTZ . May need to further escalate this medication for BP control. Daily BMET.  Monitor I's/O's, daily weights.  Consider OP sleep study.   Hypertension --BP not well controlled. Caution with current ACE and HCTZ given Cr and continue daily BMET.  Ordered 1x amlodipine 2.5mg  today and amlodipine 5mg  going forward tomorrow and to be further escalated as tolerated.  Anemia --Daily CBC. Monitor per IM / GI for blood loss anemia. Plan is for EGD/colonoscopy today.   For questions or updates, please contact [place] Please consult [email or web address] for contact info under        Signed, Arvil Chaco, PA-C  01/23/2021, 9:06 AM

## 2021-01-23 NOTE — Discharge Summary (Incomplete)
Physician Discharge Summary  Gregory Ramos MGQ:[id number] DOB: [birth year] DOA: 01/19/2021  PCP: Marden Noble, MD  Admit date: 01/19/2021 Discharge date: 01/23/2021  Discharge disposition: ***   Recommendations for Outpatient Follow-Up:   1. *** (include homehealth, outpatient follow-up instructions, specific recommendations for PCP to follow-up on, etc.) 2. LACE score = ***, corresponding to a *** % of re-admission within 30 days.   (http://tools.farmacologiaclinica.info/index.php?sid=10044)   Discharge Diagnosis:   Principal Problem:   Symptomatic anemia Active Problems:   Essential hypertension   Stage 3b chronic kidney disease (HCC)   Hematochezia   Hypoxia   Vomiting   Iron deficiency anemia due to chronic blood loss   Pulmonary nodule 1 cm or greater in diameter, left upper lobe   AV block, Mobitz 1    Discharge Condition: Stable.  Diet recommendation:  Diet Order            Diet 2 gram sodium Room service appropriate? Yes; Fluid consistency: Thin  Diet effective now           Diet - low sodium heart healthy                   Code Status: Full Code     Hospital Course:   ***    Medical Consultants:    ***   Discharge Exam:    Vitals:   01/23/21 1237 01/23/21 1247 01/23/21 1257 01/23/21 1347  BP: 129/74 132/79 133/77 (!) 151/83  Pulse:    62  Resp:    18  Temp:    98.2 F (36.8 C)  TempSrc:    Oral  SpO2: 94% 93% 94% 98%  Weight:      Height:         GEN: NAD SKIN: No rash*** EYES: EOMI*** ENT: MMM CV: RRR PULM: CTA B ABD: soft, ND, NT, +BS CNS: AAO x 3, non focal EXT: No edema or tenderness***   The results of significant diagnostics from this hospitalization (including imaging, microbiology, ancillary and laboratory) are listed below for reference.     Procedures and Diagnostic Studies:   No results found.   Labs:   Basic Metabolic Panel: Recent Labs  Lab 01/19/21 1850 01/20/21 0931 01/21/21 0527  01/23/21 0603  NA 141  --  140 138  K 3.8  --  4.0 3.8  CL 107  --  107 105  CO2 25  --  28 26  GLUCOSE 125*  --  96 109*  BUN 27*  --  24* 14  CREATININE 2.07*  --  2.10* 1.78*  CALCIUM 10.1  --  9.4 9.8  MG  --  2.1 2.1  --    GFR Estimated Creatinine Clearance: 41.7 mL/min (A) (by C-G formula based on SCr of 1.78 mg/dL (H)). Liver Function Tests: Recent Labs  Lab 01/19/21 1850  AST 13*  ALT 9  ALKPHOS 68  BILITOT 0.6  PROT 6.8  ALBUMIN 3.5   Recent Labs  Lab 01/19/21 1850  LIPASE 32   No results for input(s): AMMONIA in the last 168 hours. Coagulation profile Recent Labs  Lab 01/19/21 1850  INR 1.1    CBC: Recent Labs  Lab 01/19/21 1850 01/20/21 0140 01/20/21 0931 01/21/21 0527 01/22/21 0914 01/23/21 0603  WBC 8.8  --   --  6.3 7.1 5.9  NEUTROABS  --   --   --  4.1  --   --   HGB 8.8* 8.3* 8.0* 7.9* 8.8* 7.9*  HCT 29.1*  --   --  26.4* 28.9* 25.8*  MCV 87.1  --   --  87.4 85.8 85.4  PLT 346  --   --  257 270 242   Cardiac Enzymes: No results for input(s): CKTOTAL, CKMB, CKMBINDEX, TROPONINI in the last 168 hours. BNP: Invalid input(s): POCBNP CBG: No results for input(s): GLUCAP in the last 168 hours. D-Dimer No results for input(s): DDIMER in the last 72 hours. Hgb A1c No results for input(s): HGBA1C in the last 72 hours. Lipid Profile No results for input(s): CHOL, HDL, LDLCALC, TRIG, CHOLHDL, LDLDIRECT in the last 72 hours. Thyroid function studies No results for input(s): TSH, T4TOTAL, T3FREE, THYROIDAB in the last 72 hours.  Invalid input(s): FREET3 Anemia work up No results for input(s): VITAMINB12, FOLATE, FERRITIN, TIBC, IRON, RETICCTPCT in the last 72 hours. Microbiology Recent Results (from the past 240 hour(s))  Resp Panel by RT-PCR (Flu A&B, Covid) Nasopharyngeal Swab     Status: None   Collection Time: 01/19/21  7:25 PM   Specimen: Nasopharyngeal Swab; Nasopharyngeal(NP) swabs in vial transport medium  Result Value Ref Range  Status   SARS Coronavirus 2 by RT PCR NEGATIVE NEGATIVE Final    Comment: (NOTE) SARS-CoV-2 target nucleic acids are NOT DETECTED.  The SARS-CoV-2 RNA is generally detectable in upper respiratory specimens during the acute phase of infection. The lowest concentration of SARS-CoV-2 viral copies this assay can detect is 138 copies/mL. A negative result does not preclude SARS-Cov-2 infection and should not be used as the sole basis for treatment or other patient management decisions. A negative result may occur with  improper specimen collection/handling, submission of specimen other than nasopharyngeal swab, presence of viral mutation(s) within the areas targeted by this assay, and inadequate number of viral copies(<138 copies/mL). A negative result must be combined with clinical observations, patient history, and epidemiological information. The expected result is Negative.  Fact Sheet for Patients:  EntrepreneurPulse.com.au  Fact Sheet for Healthcare Providers:  IncredibleEmployment.be  This test is no t yet approved or cleared by the [place] FDA and  has been authorized for detection and/or diagnosis of SARS-CoV-2 by FDA under an Emergency Use Authorization (EUA). This EUA will remain  in effect (meaning this test can be used) for the duration of the COVID-19 declaration under Section 564(b)(1) of the Act, 21 U.S.C.section 360bbb-3(b)(1), unless the authorization is terminated  or revoked sooner.       Influenza A by PCR NEGATIVE NEGATIVE Final   Influenza B by PCR NEGATIVE NEGATIVE Final    Comment: (NOTE) The Xpert Xpress SARS-CoV-2/FLU/RSV plus assay is intended as an aid in the diagnosis of influenza from Nasopharyngeal swab specimens and should not be used as a sole basis for treatment. Nasal washings and aspirates are unacceptable for Xpert Xpress SARS-CoV-2/FLU/RSV testing.  Fact Sheet for  Patients: EntrepreneurPulse.com.au  Fact Sheet for Healthcare Providers: IncredibleEmployment.be  This test is not yet approved or cleared by the [place] FDA and has been authorized for detection and/or diagnosis of SARS-CoV-2 by FDA under an Emergency Use Authorization (EUA). This EUA will remain in effect (meaning this test can be used) for the duration of the COVID-19 declaration under Section 564(b)(1) of the Act, 21 U.S.C. section 360bbb-3(b)(1), unless the authorization is terminated or revoked.  Performed at [facility], [address], [place], [place] [address]      Discharge Instructions:   Discharge Instructions    AMB  Referral to Pulmonary Nodule Clinic  Complete by: As directed    Diet - low sodium heart healthy   Complete by: As directed    Increase activity slowly   Complete by: As directed      Allergies as of 01/23/2021   No Known Allergies     Medication List    STOP taking these medications   ciprofloxacin 500 MG tablet Commonly known as: Cipro   HYDROcodone-acetaminophen 5-325 MG tablet Commonly known as: Norco   metoprolol tartrate 50 MG tablet Commonly known as: LOPRESSOR   senna 8.6 MG Tabs tablet Commonly known as: SENOKOT     TAKE these medications   amLODipine 5 MG tablet Commonly known as: NORVASC Take 5 mg by mouth daily.   atorvastatin 80 MG tablet Commonly known as: LIPITOR Take 80 mg by mouth daily.   lisinopril-hydrochlorothiazide 20-25 MG tablet Commonly known as: ZESTORETIC Take 1 tablet by mouth daily at 12 noon.   omeprazole 20 MG capsule Commonly known as: PRILOSEC Take 20 mg by mouth daily.       Follow-up Information    [facility] ([facility]). Schedule an appointment as soon as possible for a visit in 1 week(s).   Contact information: [place] [phone] [address] [place]. Schedule an appointment as soon as possible for a visit in 2 week(s).   Specialty: Cardiology Contact information: [address] [place] [phone]               Time coordinating discharge: ***  Signed:  Jennye Boroughs  [facility] 01/23/2021, 2:50 PM   Pager on [email or web address]. If 7PM-7AM, please contact night-coverage at [email or web address]

## 2021-01-23 NOTE — Progress Notes (Signed)
Off the floor for procedure.

## 2021-01-23 NOTE — Anesthesia Preprocedure Evaluation (Signed)
Anesthesia Evaluation  Patient identified by MRN, date of birth, ID band Patient awake    Reviewed: Allergy & Precautions, H&P , NPO status , Patient's Chart, lab work & pertinent test results  History of Anesthesia Complications Negative for: history of anesthetic complications  Airway Mallampati: III  TM Distance: >3 FB Neck ROM: full    Dental  (+) Chipped, Poor Dentition, Missing   Pulmonary neg shortness of breath, sleep apnea and Continuous Positive Airway Pressure Ventilation , former smoker,    Pulmonary exam normal        Cardiovascular Exercise Tolerance: Good hypertension, (-) angina(-) Past MI Normal cardiovascular exam     Neuro/Psych negative neurological ROS  negative psych ROS   GI/Hepatic negative GI ROS, Neg liver ROS, neg GERD  ,  Endo/Other  negative endocrine ROS  Renal/GU Renal disease  negative genitourinary   Musculoskeletal   Abdominal   Peds  Hematology negative hematology ROS (+)   Anesthesia Other Findings Past Medical History: No date: Hyperlipidemia No date: Hypertension No date: Prostate enlargement No date: Sleep apnea     Comment:  resolved with weight loss  Past Surgical History: 02/20/2015: COLONOSCOPY; N/A     Comment:  Procedure: COLONOSCOPY;  Surgeon: Robert Bellow, MD;              Location: [facility] ENDOSCOPY;  Service: Endoscopy;                Laterality: N/A; 19080: CYST REMOVAL HAND No date: JOINT REPLACEMENT     Comment:  hip 2000?: PROSTATE BIOPSY 01/14/2017: XI ROBOTIC ASSISTED SIMPLE PROSTATECTOMY; N/A     Comment:  Procedure: XI ROBOTIC ASSISTED SIMPLE PROSTATECTOMY;                Surgeon: Alexis Frock, MD;  Location: [facility];  Service:              Urology;  Laterality: N/A;  BMI    Body Mass Index: 31.42 kg/m      Reproductive/Obstetrics negative OB ROS                             Anesthesia Physical Anesthesia  Plan  ASA: III  Anesthesia Plan: General   Post-op Pain Management:    Induction: Intravenous  PONV Risk Score and Plan: Propofol infusion and TIVA  Airway Management Planned: Natural Airway and Nasal Cannula  Additional Equipment:   Intra-op Plan:   Post-operative Plan:   Informed Consent: I have reviewed the patients History and Physical, chart, labs and discussed the procedure including the risks, benefits and alternatives for the proposed anesthesia with the patient or authorized representative who has indicated his/her understanding and acceptance.     Dental Advisory Given  Plan Discussed with: Anesthesiologist, CRNA and Surgeon  Anesthesia Plan Comments: (Patient consented for risks of anesthesia including but not limited to:  - adverse reactions to medications - risk of airway placement if required - damage to eyes, teeth, lips or other oral mucosa - nerve damage due to positioning  - sore throat or hoarseness - Damage to heart, brain, nerves, lungs, other parts of body or loss of life  Patient voiced understanding.)        Anesthesia Quick Evaluation

## 2021-01-23 NOTE — Transfer of Care (Signed)
Immediate Anesthesia Transfer of Care Note  [name]  Procedure(s) Performed: ESOPHAGOGASTRODUODENOSCOPY (EGD) WITH PROPOFOL (N/A ) COLONOSCOPY WITH PROPOFOL (N/A )  Patient Location: PACU and Endoscopy Unit  Anesthesia Type:General  Level of Consciousness: drowsy  Airway & Oxygen Therapy: Patient Spontanous Breathing  Post-op Assessment: Report given to RN and Post -op Vital signs reviewed and stable  Post vital signs: Reviewed and stable  Last Vitals:  Vitals Value Taken Time  BP 112/62 01/23/21 1227  Temp 36.3 C 01/23/21 1227  Pulse 80 01/23/21 1227  Resp 23 01/23/21 1228  SpO2 94 % 01/23/21 1227  Vitals shown include unvalidated device data.  Last Pain:  Vitals:   01/23/21 1227  TempSrc: Temporal  PainSc:          Complications: No complications documented.

## 2021-01-23 NOTE — Progress Notes (Signed)
   01/23/21 1941  Assess: MEWS Score  Temp (!) 101.3 F (38.5 C)  BP 115/71  Pulse Rate (!) 141  Resp (!) 22  SpO2 100 %  O2 Device Nasal Cannula  O2 Flow Rate (L/min) 4 L/min  Assess: MEWS Score  MEWS Temp 1  MEWS Systolic 0  MEWS Pulse 3  MEWS RR 1  MEWS LOC 0  MEWS Score 5  MEWS Score Color Red  Assess: if the MEWS score is Yellow or Red  Were vital signs taken at a resting state? Yes  Focused Assessment No change from prior assessment  Early Detection of Sepsis Score *See Row Information* High  MEWS guidelines implemented *See Row Information* No, previously yellow, continue vital signs every 4 hours  Treat  MEWS Interventions Administered prn meds/treatments  Pain Scale 0-10  Pain Score 0  Notify: Charge Nurse/RN  Name of Charge Nurse/RN Notified Melissa  Date Charge Nurse/RN Notified 01/23/21  Time Charge Nurse/RN Notified 2020  Notify: Provider  Provider Name/Title Dr.Opyd  Date Provider Notified 01/23/21  Time Provider Notified 2006  Notification Type Call  Notification Reason Change in status  Provider response See new orders  Date of Provider Response 01/23/21  Time of Provider Response 2011   Notified Dr.Optyd about patient's current condition. Patient's heart rate has been elevated since 1800 per dayshift nurse, whom stated she was unable to contact on call physician. Patient has dizziness with movement. Administered tylenol for elevated temperature and iv fluids per MD orders.

## 2021-01-23 NOTE — Progress Notes (Signed)
Per dr. Mickle Plumb okay to give po bp medication if systolic blood pressure 063-494. If less okay to give after egd/colonscopy. Rechecked blood pressure. Currently 144/80. Will continue to monitor

## 2021-01-23 NOTE — Progress Notes (Signed)
Rapid Response Consult:  Patient seen by RRT at the request of the patient's bedside RN, Crystal. Patient's VS listed on flowsheet. Patient is AA+Ox4. Reports chills and is visibly rigorous. Patient's RN spoke with Dr. Mal Misty over telephone while I was in the room. Plan is for CXR. No reaction drugs were ordered by MD. No emergent need for escalation at this time.

## 2021-01-23 NOTE — Progress Notes (Signed)
Mobility Specialist - Progress Note   01/23/21 1200  Mobility  Activity Ambulated in hall  Level of Assistance Independent  Assistive Device None  Distance Ambulated (ft) 160 ft  Mobility Response Tolerated well  Mobility performed by Mobility specialist  $Mobility charge 1 Mobility    Pre-mobility: 83 HR, 92% SpO2 During mobility: 111 HR, 98% SpO2 Post-mobility: 91 HR, 99% SpO2   Pt received EOB before ambulating in hallway without AD. No LOB. Denied dizziness. Denied SOB on RA. No reports of exhaustion after activity. Left EOB.    Kathee Delton Mobility Specialist 01/23/21, 12:23 PM

## 2021-01-23 NOTE — Progress Notes (Signed)
Pharmacy Antibiotic Note  Gregory Ramos is a 80 y.o. male with medical history significant for HTN and HLD admitted on 01/19/2021 for symptomatic anemia due to acute/subacute GI blood loss.  Pharmacy has been consulted for Zosyn dosing for pneumonia. Lactic acid 3.7, WBC 5.9>12.4, Scr 2.1>1.80.  4/27 CXR: Left upper lobe nodule, increased in size since the prior radiograph. Further evaluation with CT is recommended. Diffuse reticulonodular densities in the left lung may represent atelectasis, atypical pneumonia, or less likely asymmetric edema  Plan: Zosyn 3.375g IV q8h (4 hour infusion).   Monitor renal function and adjust dose as clinically indicated  Height: 5\' 11"  (180.3 cm) Weight: 102.2 kg (225 lb 5 oz) IBW/kg (Calculated) : 75.3  Temp (24hrs), Avg:97.9 F (36.6 C), Min:97.3 F (36.3 C), Max:98.3 F (36.8 C)  Recent Labs  Lab 01/19/21 1850 01/21/21 0527 01/22/21 0914 01/23/21 0603 01/23/21 1706  WBC 8.8 6.3 7.1 5.9 12.4*  CREATININE 2.07* 2.10*  --  1.78* 1.80*  LATICACIDVEN  --   --   --   --  3.7*    Estimated Creatinine Clearance: 41.2 mL/min (A) (by C-G formula based on SCr of 1.8 mg/dL (H)).    No Known Allergies  Antimicrobials this admission: 4/27 Zosyn >>  Dose adjustments this admission:  Microbiology results: 4/27 BCx: sent  Thank you for allowing pharmacy to be a part of this patient's care.  Sherilyn Banker, PharmD Pharmacy Resident  01/23/2021 7:29 PM

## 2021-01-23 NOTE — Op Note (Signed)
[facility] Gastroenterology Patient Name: Gregory Ramos Procedure Date: 01/23/2021 11:13 AM MRN: [id number] Account #: [id number] Date of Birth: June 02, 1941 Admit Type: Inpatient Age: 80 Room: [facility] ENDO ROOM 4 Gender: Male Note Status: Finalized Procedure:             Upper GI endoscopy Indications:           Unexplained iron deficiency anemia Providers:             Lin Landsman MD, MD Referring MD:          [facility] (Referring MD) Medicines:             General Anesthesia Complications:         No immediate complications. Estimated blood loss: None. Procedure:             Pre-Anesthesia Assessment:                        - Prior to the procedure, a History and Physical was                         performed, and patient medications and allergies were                         reviewed. The patient is competent. The risks and                         benefits of the procedure and the sedation options and                         risks were discussed with the patient. All questions                         were answered and informed consent was obtained.                         Patient identification and proposed procedure were                         verified by the physician, the nurse, the                         anesthesiologist, the anesthetist and the technician                         in the pre-procedure area in the procedure room in the                         endoscopy suite. Mental Status Examination: alert and                         oriented. Airway Examination: normal oropharyngeal                         airway and neck mobility. Respiratory Examination:                         clear to auscultation. CV Examination: normal.  Prophylactic Antibiotics: The patient does not require                         prophylactic antibiotics. Prior Anticoagulants: The                         patient has taken no previous  anticoagulant or                         antiplatelet agents. ASA Grade Assessment: III - A                         patient with severe systemic disease. After reviewing                         the risks and benefits, the patient was deemed in                         satisfactory condition to undergo the procedure. The                         anesthesia plan was to use general anesthesia.                         Immediately prior to administration of medications,                         the patient was re-assessed for adequacy to receive                         sedatives. The heart rate, respiratory rate, oxygen                         saturations, blood pressure, adequacy of pulmonary                         ventilation, and response to care were monitored                         throughout the procedure. The physical status of the                         patient was re-assessed after the procedure.                        After obtaining informed consent, the endoscope was                         passed under direct vision. Throughout the procedure,                         the patient's blood pressure, pulse, and oxygen                         saturations were monitored continuously. The Endoscope                         was introduced through the mouth, and advanced to the  second part of duodenum. The upper GI endoscopy was                         accomplished without difficulty. The patient tolerated                         the procedure well. Findings:      The duodenal bulb and second portion of the duodenum were normal.      The entire examined stomach was normal. Biopsies were taken with a cold       forceps for Helicobacter pylori testing.      Esophagogastric landmarks were identified: the gastroesophageal junction       was found at 45 cm from the incisors.      The gastroesophageal junction and examined esophagus were normal. Impression:            -  Normal duodenal bulb and second portion of the                         duodenum.                        - Normal stomach. Biopsied.                        - Esophagogastric landmarks identified.                        - Normal gastroesophageal junction and esophagus. Recommendation:        - Await pathology results.                        - Proceed with colonoscopy as scheduled                        See colonoscopy report Procedure Code(s):     --- Professional ---                        [phone], Esophagogastroduodenoscopy, flexible,                         transoral; with biopsy, single or multiple Diagnosis Code(s):     --- Professional ---                        D50.9, Iron deficiency anemia, unspecified CPT copyright 2019 American Medical Association. All rights reserved. The codes documented in this report are preliminary and upon coder review may  be revised to meet current compliance requirements. Dr. Ulyess Mort Lin Landsman MD, MD 01/23/2021 11:38:20 AM This report has been signed electronically. Number of Addenda: 0 Note Initiated On: 01/23/2021 11:13 AM Estimated Blood Loss:  Estimated blood loss: none.      [facility]

## 2021-01-23 NOTE — Progress Notes (Signed)
Made dr. Mal Misty aware patient heart rate sustaining 140, nothing ordered prn. Also made md aware lactic is 3.7, md order blood cultures and antibiotics. Will continue to monitor closely

## 2021-01-23 NOTE — Progress Notes (Signed)
Called into room my tech. Patient shaking states he is cold. sbp increased 190, heart rate 90-100. Oxygen dropped to 88-89. Patient placed on o2 at 2l, sat increased to 97. Aleene Davidson icu was called to look at patient. Also text paged and paged dr. Mal Misty

## 2021-01-23 NOTE — Op Note (Signed)
[facility] Gastroenterology Patient Name: Gregory Ramos Procedure Date: 01/23/2021 11:13 AM MRN: [id number] Account #: [id number] Date of Birth: [birth year] Admit Type: Inpatient Age: 80 Room: [facility] ENDO ROOM 4 Gender: Male Note Status: Finalized Procedure:             Colonoscopy Indications:           Last colonoscopy: May 2016, Hematochezia, Unexplained                         iron deficiency anemia Providers:             Lin Landsman MD, MD Referring MD:          [facility] (Referring MD) Medicines:             General Anesthesia Complications:         No immediate complications. Estimated blood loss:                         Minimal. Procedure:             Pre-Anesthesia Assessment:                        - Prior to the procedure, a History and Physical was                         performed, and patient medications and allergies were                         reviewed. The patient is competent. The risks and                         benefits of the procedure and the sedation options and                         risks were discussed with the patient. All questions                         were answered and informed consent was obtained.                         Patient identification and proposed procedure were                         verified by the physician, the nurse, the                         anesthesiologist, the anesthetist and the technician                         in the pre-procedure area in the procedure room in the                         endoscopy suite. Mental Status Examination: alert and                         oriented. Airway Examination: normal oropharyngeal  airway and neck mobility. Respiratory Examination:                         clear to auscultation. CV Examination: normal.                         Prophylactic Antibiotics: The patient does not require                         prophylactic antibiotics.  Prior Anticoagulants: The                         patient has taken no previous anticoagulant or                         antiplatelet agents. ASA Grade Assessment: III - A                         patient with severe systemic disease. After reviewing                         the risks and benefits, the patient was deemed in                         satisfactory condition to undergo the procedure. The                         anesthesia plan was to use general anesthesia.                         Immediately prior to administration of medications,                         the patient was re-assessed for adequacy to receive                         sedatives. The heart rate, respiratory rate, oxygen                         saturations, blood pressure, adequacy of pulmonary                         ventilation, and response to care were monitored                         throughout the procedure. The physical status of the                         patient was re-assessed after the procedure.                        After obtaining informed consent, the colonoscope was                         passed under direct vision. Throughout the procedure,                         the patient's blood pressure, pulse, and oxygen  saturations were monitored continuously. The                         Colonoscope was introduced through the anus and                         advanced to the the cecum, identified by appendiceal                         orifice and ileocecal valve. The colonoscopy was                         performed with moderate difficulty due to significant                         looping and the patient's body habitus. Successful                         completion of the procedure was aided by applying                         abdominal pressure. The patient tolerated the                         procedure well. The quality of the bowel preparation                         was  adequate. Findings:      The perianal and digital rectal examinations were normal. Pertinent       negatives include normal sphincter tone and no palpable rectal lesions.      A 8 mm polyp was found in the cecum. The polyp was pedunculated. The       polyp was removed with a hot snare. Resection and retrieval were       complete.      A 5 mm polyp was found in the cecum. The polyp was sessile. The polyp       was removed with a cold snare. Resection and retrieval were complete.      A 6 mm polyp was found in the ascending colon. The polyp was       pedunculated. The polyp was removed with a hot snare. Polyp resection       was incomplete, and the resected tissue was not retrieved.      Two sessile polyps were found in the ascending colon. The polyps were 3       to 5 mm in size. These polyps were removed with a cold snare. Resection       and retrieval were complete.      A fungating, infiltrative and ulcerated non-obstructing large mass was       found in the distal descending colon. This was the site of previous       incomplete polypectomy identified by tattoo. The mass was partially       circumferential (involving two-thirds of the lumen circumference). No       bleeding was present. Biopsies were taken with a cold forceps for       histology. Area was tattooed with an injection of Spot (carbon black).      Non-bleeding external and internal hemorrhoids were found during  retroflexion. The hemorrhoids were large. Impression:            - One 8 mm polyp in the cecum, removed with a hot                         snare. Resected and retrieved.                        - One 5 mm polyp in the cecum, removed with a cold                         snare. Resected and retrieved.                        - One 6 mm polyp in the ascending colon, removed with                         a hot snare. Incomplete resection. Resected tissue not                         retrieved.                        -  Two 3 to 5 mm polyps in the ascending colon, removed                         with a cold snare. Resected and retrieved.                        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                         Biopsied. Tattooed.                        - Non-bleeding external and internal hemorrhoids. Recommendation:        - Return patient to hospital ward for ongoing care.                        - Resume regular diet today.                        - Await pathology results.                        - Continue present medications.                        - Refer to an oncologist at appointment to be                         scheduled.                        - Return patient to hospital ward for possible                         discharge same day. Procedure Code(s):     --- Professional ---  45385, Colonoscopy, flexible; with removal of                         tumor(s), polyp(s), or other lesion(s) by snare                         technique                        45381, Colonoscopy, flexible; with directed submucosal                         injection(s), any substance                        93267, 36, Colonoscopy, flexible; with biopsy, single                         or multiple Diagnosis Code(s):     --- Professional ---                        K63.5, Polyp of colon                        C18.6, Malignant neoplasm of descending colon                        K92.1, Melena (includes Hematochezia)                        D50.9, Iron deficiency anemia, unspecified                        K64.8, Other hemorrhoids CPT copyright 2019 American Medical Association. All rights reserved. The codes documented in this report are preliminary and upon coder review may  be revised to meet current compliance requirements. Dr. Ulyess Mort Lin Landsman MD, MD 01/23/2021 12:29:34 PM This report has been signed electronically. Number of Addenda: 0 Note Initiated On: 01/23/2021  11:13 AM Scope Withdrawal Time: 0 hours 32 minutes 0 seconds  Total Procedure Duration: 0 hours 42 minutes 24 seconds  Estimated Blood Loss:  Estimated blood loss was minimal.      [facility]

## 2021-01-23 NOTE — Progress Notes (Signed)
Cross-coverage note:   Notified of HR 140 and lactic acid 3.7. BP is stable. He was started on Zosyn this evening. Plan to give 500 cc NS bolus, obtain EKG, trend lactate, repeat H&H.

## 2021-01-24 ENCOUNTER — Other Ambulatory Visit: Payer: Self-pay

## 2021-01-24 ENCOUNTER — Inpatient Hospital Stay: Payer: Medicare Other

## 2021-01-24 ENCOUNTER — Other Ambulatory Visit: Payer: Self-pay | Admitting: Anatomic Pathology & Clinical Pathology

## 2021-01-24 ENCOUNTER — Encounter: Payer: Self-pay | Admitting: Gastroenterology

## 2021-01-24 DIAGNOSIS — C189 Malignant neoplasm of colon, unspecified: Secondary | ICD-10-CM

## 2021-01-24 DIAGNOSIS — J189 Pneumonia, unspecified organism: Secondary | ICD-10-CM

## 2021-01-24 DIAGNOSIS — A419 Sepsis, unspecified organism: Secondary | ICD-10-CM | POA: Diagnosis not present

## 2021-01-24 DIAGNOSIS — R6520 Severe sepsis without septic shock: Secondary | ICD-10-CM

## 2021-01-24 LAB — CBC WITH DIFFERENTIAL/PLATELET
Abs Immature Granulocytes: 0.06 10*3/uL (ref 0.00–0.07)
Basophils Absolute: 0 10*3/uL (ref 0.0–0.1)
Basophils Relative: 0 %
Eosinophils Absolute: 0 10*3/uL (ref 0.0–0.5)
Eosinophils Relative: 0 %
HCT: 26.1 % — ABNORMAL LOW (ref 39.0–52.0)
Hemoglobin: 8.1 g/dL — ABNORMAL LOW (ref 13.0–17.0)
Immature Granulocytes: 1 %
Lymphocytes Relative: 8 %
Lymphs Abs: 1.1 10*3/uL (ref 0.7–4.0)
MCH: 26.9 pg (ref 26.0–34.0)
MCHC: 31 g/dL (ref 30.0–36.0)
MCV: 86.7 fL (ref 80.0–100.0)
Monocytes Absolute: 0.8 10*3/uL (ref 0.1–1.0)
Monocytes Relative: 6 %
Neutro Abs: 10.8 10*3/uL — ABNORMAL HIGH (ref 1.7–7.7)
Neutrophils Relative %: 85 %
Platelets: 232 10*3/uL (ref 150–400)
RBC: 3.01 MIL/uL — ABNORMAL LOW (ref 4.22–5.81)
RDW: 14.8 % (ref 11.5–15.5)
WBC: 12.8 10*3/uL — ABNORMAL HIGH (ref 4.0–10.5)
nRBC: 0.2 % (ref 0.0–0.2)

## 2021-01-24 LAB — BASIC METABOLIC PANEL
Anion gap: 7 (ref 5–15)
BUN: 15 mg/dL (ref 8–23)
CO2: 26 mmol/L (ref 22–32)
Calcium: 9.2 mg/dL (ref 8.9–10.3)
Chloride: 105 mmol/L (ref 98–111)
Creatinine, Ser: 2.06 mg/dL — ABNORMAL HIGH (ref 0.61–1.24)
GFR, Estimated: 32 mL/min — ABNORMAL LOW (ref >60)
Glucose, Bld: 109 mg/dL — ABNORMAL HIGH (ref 70–99)
Potassium: 4.2 mmol/L (ref 3.5–5.1)
Sodium: 138 mmol/L (ref 135–145)

## 2021-01-24 LAB — MRSA PCR SCREENING: MRSA by PCR: NEGATIVE

## 2021-01-24 LAB — SURGICAL PATHOLOGY

## 2021-01-24 LAB — PROCALCITONIN: Procalcitonin: 6.16 ng/mL

## 2021-01-24 NOTE — Op Note (Signed)
[facility] Gastroenterology Patient Name: Gregory Ramos Procedure Date: 02/20/2015 3:21 PM MRN: [id number] Account #: [id number] Date of Birth: 1941-07-31 Admit Type: Outpatient Age: 80 Room: [facility] ENDO ROOM 1 Gender: Male Note Status: Supervisor Override Procedure:             Colonoscopy Indications:           Screening for colorectal malignant neoplasm Providers:             Robert Bellow, MD Referring MD:          Mikeal Hawthorne. Brynda Greathouse, MD (Referring MD) Medicines:             Monitored Anesthesia Care Complications:         No immediate complications. Procedure:             Pre-Anesthesia Assessment:                        - Prior to the procedure, a History and Physical was                         performed, and patient medications, allergies and                         sensitivities were reviewed. The patient's tolerance                         of previous anesthesia was reviewed.                        - The risks and benefits of the procedure and the                         sedation options and risks were discussed with the                         patient. All questions were answered and informed                         consent was obtained.                        After obtaining informed consent, the colonoscope was                         passed under direct vision. Throughout the procedure,                         the patient's blood pressure, pulse, and oxygen                         saturations were monitored continuously. The Olympus                         CF-H180AL colonoscope ( S#: [id number] ) was introduced                         through the anus and advanced to the the cecum,  identified by appendiceal orifice and ileocecal valve.                         The colonoscopy was performed with moderate difficulty                         due to significant looping and a tortuous colon.                         Successful  completion of the procedure was aided by                         using manual pressure. The patient tolerated the                         procedure well. The quality of the bowel preparation                         was excellent. Findings:      A 7 mm polyp was found in the ascending colon. The polyp was sessile.       Biopsies were taken with a cold forceps for histology.      A 9 mm polyp was found in the recto-sigmoid colon. The polyp was       pedunculated. The polyp was removed with a hot snare. Resection and       retrieval were complete.      A 30 mm polyp was found in the descending colon in the distal descending       colon. The polyp was semi-pedunculated. Approximately 90% of the polyp       was removed. Resection was incomplete secondary to location on a fold.       Area was successfully injected with 4 mL [place] ink for tattooing. Impression:            - One 7 mm polyp in the ascending colon. Biopsied.                        - One 9 mm polyp at the recto-sigmoid colon. Resected                         and retrieved.                        - One 25 mm polyp in the descending colon in the                         distal descending colon. Injected. Recommendation:        - Telephone endoscopist for pathology results in 1                         week. Procedure Code(s):     --- Professional ---                        [phone], Colonoscopy, flexible; with removal of                         tumor(s), polyp(s), or other lesion(s) by snare  technique                        [id number], 59, Colonoscopy, flexible; with biopsy, single                         or multiple                        45381, Colonoscopy, flexible; with directed submucosal                         injection(s), any substance Diagnosis Code(s):     --- Professional ---                        Z12.11, Encounter for screening for malignant neoplasm                         of colon                         D12.2, Benign neoplasm of ascending colon                        D12.7, Benign neoplasm of rectosigmoid junction                        D12.4, Benign neoplasm of descending colon CPT copyright 2019 American Medical Association. All rights reserved. The codes documented in this report are preliminary and upon coder review may  be revised to meet current compliance requirements. Robert Bellow, MD 02/20/2015 4:33:36 PM This report has been signed electronically. Number of Addenda: 0 Note Initiated On: 02/20/2015 3:21 PM Scope Withdrawal Time: 0 hours 23 minutes 16 seconds  Total Procedure Duration: 0 hours 41 minutes 26 seconds       [facility]

## 2021-01-24 NOTE — Anesthesia Postprocedure Evaluation (Signed)
Anesthesia Post Note  Patient: HARRY SHUCK  Procedure(s) Performed: ESOPHAGOGASTRODUODENOSCOPY (EGD) WITH PROPOFOL (N/A ) COLONOSCOPY WITH PROPOFOL (N/A )  Patient location during evaluation: Endoscopy Anesthesia Type: General Level of consciousness: awake and alert Pain management: pain level controlled Vital Signs Assessment: post-procedure vital signs reviewed and stable Respiratory status: spontaneous breathing, nonlabored ventilation, respiratory function stable and patient connected to nasal cannula oxygen Cardiovascular status: blood pressure returned to baseline and stable Postop Assessment: no apparent nausea or vomiting Anesthetic complications: no   No complications documented.   Last Vitals:  Vitals:   01/23/21 2319 01/24/21 0443  BP: 98/64 (!) 116/53  Pulse: 97 67  Resp: 18 19  Temp: 37 C 36.7 C  SpO2: 100% 100%    Last Pain:  Vitals:   01/24/21 0443  TempSrc: Oral  PainSc:                  Precious Haws Darshawn Boateng

## 2021-01-24 NOTE — Progress Notes (Signed)
Per Dr. Marius Ditch and colonoscopy report refer to oncology for colon cancer

## 2021-01-24 NOTE — Progress Notes (Signed)
Mobility Specialist - Progress Note   01/24/21 1300  Mobility  Activity Ambulated in hall  Level of Assistance Independent  Assistive Device None  Distance Ambulated (ft) 160 ft  Mobility Response Tolerated well  Mobility performed by Mobility specialist  $Mobility charge 1 Mobility    Pre-mobility: 84 HR, 95% SpO2 Post-mobility: 100 HR, 98% SpO2   Pt ambulated in hallway without AD. No LOB. Independent. No dizziness. O2 appeared to desat to 88% on RA via pulse ox, but unclear of accuracy. Pt denied SOB. No s/s of distress. Left in bed with alarm set. Family at bedside.    Kathee Delton Mobility Specialist 01/24/21, 1:11 PM

## 2021-01-24 NOTE — Progress Notes (Addendum)
Progress Note    Gregory Ramos  EZM:[id number] DOB: October 01, 1940  DOA: 01/19/2021 PCP: Marden Noble, MD      Brief Narrative:    Medical records reviewed and are as summarized below:  Gregory Ramos is a 80 y.o. male       Assessment/Plan:   Principal Problem:   Symptomatic anemia Active Problems:   Essential hypertension   Stage 3b chronic kidney disease ([place])   Hematochezia   Hypoxia   Vomiting   Iron deficiency anemia due to chronic blood loss   Pulmonary nodule 1 cm or greater in diameter, left upper lobe   AV block, Mobitz 1   Sepsis (H[facility])   Pneumonia   Colon cancer ([place])   Body mass index is 31.49 kg/m.  (Obesity)  Symptomatic iron deficiency anemia, Hematochezia: S/p IV iron infusion x 3 doses.   Sepsis secondary to pneumonia, sinus tachycardia: Continue empiric IV antibiotics.  Follow-up blood cultures.  MRSA screen is pending.  Venous duplex of the lower extremities done on 01/24/2021 was negative.   Acute hypoxic respiratory failure: He is on 4 L/min oxygen via nasal cannula.  Taper off oxygen as able. Obtain stat chest x-ray, BNP, Procalcitonin, lactic acid,CBC and BMP.  Colon cancer: He underwent EGD and colonoscopy on 01/23/2021.  EGD was unremarkable.  Colonoscopy showed colon mass and polyps.  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  Outpatient follow-up with oncologist for further management.  First-degree AV block, Mobitz type I: Metoprolol has been discontinued.  Telemetry shows normal sinus rhythm.  Zio patch to be placed at discharge.  Outpatient follow-up with cardiologist.  CKD stage IIIb: Creatinine is stable.  Diet Order            Diet 2 gram sodium Room service appropriate? Yes; Fluid consistency: Thin  Diet effective now           Diet - low sodium heart healthy                    Consultants:  Cardiologist  Gastroenterologist  Procedures:  EGD and  colonoscopy    Medications:   . amLODipine  5 mg Oral Daily  . lisinopril  20 mg Oral Daily   And  . hydrochlorothiazide  25 mg Oral Daily  . pantoprazole  40 mg Intravenous Q12H  . senna-docusate  2 tablet Oral BID  . sodium chloride flush  10 mL Intravenous Q12H   Continuous Infusions: . sodium chloride 10 mL/hr at 01/24/21 1252  . piperacillin-tazobactam (ZOSYN)  IV 3.375 g (01/24/21 1253)  . promethazine (PHENERGAN) injection (IM or IVPB)       Anti-infectives (From admission, onward)   Start     Dose/Rate Route Frequency Ordered Stop   01/23/21 2000  piperacillin-tazobactam (ZOSYN) IVPB 3.375 g        3.375 g 12.5 mL/hr over 240 Minutes Intravenous Every 8 hours 01/23/21 1931               Family Communication/Anticipated D/C date and plan/Code Status   DVT prophylaxis: SCDs Start: 01/19/21 2051     Code Status: Full Code  Family Communication: Wife and daughter at the bedside Disposition Plan:    Status is: Inpatient  Remains inpatient appropriate because:Inpatient level of care appropriate due to severity of illness   Dispo: The patient is from: Home              Anticipated d/c  is to: Home              Patient currently is not medically stable to d/c.   Difficult to place patient No           Subjective:   Interval events noted.  She had fever with T-max of 101.3 at last night.  He had a cough last night but he thinks cough is a little better today.  No shortness of breath or chest pain.  No vomiting, abdominal pain or diarrhea.   Objective:    Vitals:   01/23/21 2319 01/24/21 0443 01/24/21 0900 01/24/21 1158  BP: 98/64 (!) 116/53  (!) 102/59  Pulse: 97 67  83  Resp: 18 19  16   Temp: 98.6 F (37 C) 98.1 F (36.7 C)  99 F (37.2 C)  TempSrc:  Oral  Oral  SpO2: 100% 100%  98%  Weight:   102.4 kg   Height:       No data found.   Intake/Output Summary (Last 24 hours) at 01/24/2021 1551 Last data filed at 01/24/2021  1349 Gross per 24 hour  Intake 770.77 ml  Output 550 ml  Net 220.77 ml   Filed Weights   01/20/21 0754 01/23/21 0746 01/24/21 0900  Weight: 102.9 kg 102.2 kg 102.4 kg    Exam:  GEN: NAD SKIN: No rash EYES: EOMI ENT: MMM CV: RRR PULM: CTA B ABD: soft, obese, NT, +BS CNS: AAO x 3, non focal EXT: No edema or tenderness        Data Reviewed:   I have personally reviewed following labs and imaging studies:  Labs: Labs show the following:   Basic Metabolic Panel: Recent Labs  Lab 01/19/21 1850 01/20/21 0931 01/21/21 0527 01/23/21 0603 01/23/21 1706 01/24/21 0829  NA 141  --  140 138 139 138  K 3.8  --  4.0 3.8 4.4 4.2  CL 107  --  107 105 105 105  CO2 25  --  28 26 26 26   GLUCOSE 125*  --  96 109* 95 109*  BUN 27*  --  24* 14 13 15   CREATININE 2.07*  --  2.10* 1.78* 1.80* 2.06*  CALCIUM 10.1  --  9.4 9.8 9.9 9.2  MG  --  2.1 2.1  --   --   --    GFR Estimated Creatinine Clearance: 36 mL/min (A) (by C-G formula based on SCr of 2.06 mg/dL (H)). Liver Function Tests: Recent Labs  Lab 01/19/21 1850  AST 13*  ALT 9  ALKPHOS 68  BILITOT 0.6  PROT 6.8  ALBUMIN 3.5   Recent Labs  Lab 01/19/21 1850  LIPASE 32   No results for input(s): AMMONIA in the last 168 hours. Coagulation profile Recent Labs  Lab 01/19/21 1850  INR 1.1    CBC: Recent Labs  Lab 01/21/21 0527 01/22/21 0914 01/23/21 0603 01/23/21 1706 01/23/21 2211 01/24/21 0829  WBC 6.3 7.1 5.9 12.4*  --  12.8*  NEUTROABS 4.1  --   --  10.9*  --  10.8*  HGB 7.9* 8.8* 7.9* 9.2* 8.2* 8.1*  HCT 26.4* 28.9* 25.8* 30.5* 27.1* 26.1*  MCV 87.4 85.8 85.4 88.2  --  86.7  PLT 257 270 242 277  --  232   Cardiac Enzymes: No results for input(s): CKTOTAL, CKMB, CKMBINDEX, TROPONINI in the last 168 hours. BNP (last 3 results) No results for input(s): PROBNP in the last 8760 hours. CBG: No results for input(s): GLUCAP in  the last 168 hours. D-Dimer: No results for input(s): DDIMER in the  last 72 hours. Hgb A1c: No results for input(s): HGBA1C in the last 72 hours. Lipid Profile: No results for input(s): CHOL, HDL, LDLCALC, TRIG, CHOLHDL, LDLDIRECT in the last 72 hours. Thyroid function studies: No results for input(s): TSH, T4TOTAL, T3FREE, THYROIDAB in the last 72 hours.  Invalid input(s): FREET3 Anemia work up: No results for input(s): VITAMINB12, FOLATE, FERRITIN, TIBC, IRON, RETICCTPCT in the last 72 hours. Sepsis Labs: Recent Labs  Lab 01/22/21 0914 01/23/21 0603 01/23/21 1706 01/23/21 2211 01/24/21 0829  PROCALCITON  --   --  0.17  --  6.16  WBC 7.1 5.9 12.4*  --  12.8*  LATICACIDVEN  --   --  3.7* 1.2  --     Microbiology Recent Results (from the past 240 hour(s))  Resp Panel by RT-PCR (Flu A&B, Covid) Nasopharyngeal Swab     Status: None   Collection Time: 01/19/21  7:25 PM   Specimen: Nasopharyngeal Swab; Nasopharyngeal(NP) swabs in vial transport medium  Result Value Ref Range Status   SARS Coronavirus 2 by RT PCR NEGATIVE NEGATIVE Final    Comment: (NOTE) SARS-CoV-2 target nucleic acids are NOT DETECTED.  The SARS-CoV-2 RNA is generally detectable in upper respiratory specimens during the acute phase of infection. The lowest concentration of SARS-CoV-2 viral copies this assay can detect is 138 copies/mL. A negative result does not preclude SARS-Cov-2 infection and should not be used as the sole basis for treatment or other patient management decisions. A negative result may occur with  improper specimen collection/handling, submission of specimen other than nasopharyngeal swab, presence of viral mutation(s) within the areas targeted by this assay, and inadequate number of viral copies(<138 copies/mL). A negative result must be combined with clinical observations, patient history, and epidemiological information. The expected result is Negative.  Fact Sheet for Patients:  EntrepreneurPulse.com.au  Fact Sheet for Healthcare  Providers:  IncredibleEmployment.be  This test is no t yet approved or cleared by the [place] FDA and  has been authorized for detection and/or diagnosis of SARS-CoV-2 by FDA under an Emergency Use Authorization (EUA). This EUA will remain  in effect (meaning this test can be used) for the duration of the COVID-19 declaration under Section 564(b)(1) of the Act, 21 U.S.C.section 360bbb-3(b)(1), unless the authorization is terminated  or revoked sooner.       Influenza A by PCR NEGATIVE NEGATIVE Final   Influenza B by PCR NEGATIVE NEGATIVE Final    Comment: (NOTE) The Xpert Xpress SARS-CoV-2/FLU/RSV plus assay is intended as an aid in the diagnosis of influenza from Nasopharyngeal swab specimens and should not be used as a sole basis for treatment. Nasal washings and aspirates are unacceptable for Xpert Xpress SARS-CoV-2/FLU/RSV testing.  Fact Sheet for Patients: EntrepreneurPulse.com.au  Fact Sheet for Healthcare Providers: IncredibleEmployment.be  This test is not yet approved or cleared by the [place] FDA and has been authorized for detection and/or diagnosis of SARS-CoV-2 by FDA under an Emergency Use Authorization (EUA). This EUA will remain in effect (meaning this test can be used) for the duration of the COVID-19 declaration under Section 564(b)(1) of the Act, 21 U.S.C. section 360bbb-3(b)(1), unless the authorization is terminated or revoked.  Performed at [facility], [address], [place], [place] [address]     Procedures and diagnostic studies:  CT CHEST WO CONTRAST  Result Date: 01/24/2021 CLINICAL DATA:  Pneumonia. EXAM: CT CHEST WITHOUT CONTRAST TECHNIQUE: Multidetector CT imaging of  the chest was performed following the standard protocol without IV contrast. COMPARISON:  01/16/2011 FINDINGS: Cardiovascular: Heart size upper normal. Coronary artery calcification is evident.  Atherosclerotic calcification is noted in the wall of the thoracic aorta. Enlargement of the pulmonary outflow tract and main pulmonary arteries suggests pulmonary arterial hypertension. Mediastinum/Nodes: No mediastinal lymphadenopathy. Heterogeneous thyroid parenchyma without a discrete nodule or mass evident. No evidence for gross hilar lymphadenopathy although assessment is limited by the lack of intravenous contrast on today's study. The esophagus has normal imaging features. There is no axillary lymphadenopathy. Lungs/Pleura: Patchy and nodular airspace disease is seen in the central left upper lobe extending out to the lateral pleura. There is some associated volume loss/atelectasis in the posterior left upper lobe. Posterior left upper lobe nodule measuring 1.9 cm on image 45/3 was 1.6 cm when remeasured in a similar fashion on the prior study. 7 mm right perifissural nodule on 86/3 is new in the interval. There is new right base collapse/consolidation with stable 6 mm right lower lobe paraspinal nodule on 84/3. 4 mm left lower lobe nodule on 96/3 was 5 mm previously (remeasured). Tiny bilateral pleural effusions evident. Upper Abdomen: Similar appearance 1.9 cm water density lesion in the dome of the liver, likely a cyst. Multiple cystic lesions in both kidneys again noted, progressive in the interval and incompletely visualized on this noncontrast study. New low-density lesions in the spleen measure 2.7 in 3.4 cm respectively, new since 20/12. Similar appearance of bilateral adrenal adenomas. Musculoskeletal: Small sclerotic focus posterior right second rib is stable consistent with benign etiology. No worrisome lytic or sclerotic osseous abnormality. IMPRESSION: 1. New patchy and nodular airspace disease in the central left upper lobe compatible with infectious/inflammatory etiology. 2. New posterior left upper lobe and right base collapse/consolidation with new tiny bilateral pleural effusions. 3. 1.9 cm  well-defined nodule in the posterior left upper lobe has increased from 1.6 cm on the study from 2012. Given the relatively minimal change over such a long time frame, this is most compatible with a benign etiology. 4. Interval development of low-density lesions in the spleen measuring up to 3.4 cm respectively. These are new since 20/12/202 and while likely benign, MRI abdomen with and without contrast might be able to provide some additional characterization, as clinically warranted. 5. Similar appearance of bilateral adrenal adenomas. 6. Interval progression of low-density bilateral renal lesions, incompletely characterized on this noncontrast study. These were present on prior exams from 2012 and 2014 suggesting renal cysts. 7. Aortic Atherosclerosis (ICD10-I70.0). Electronically Signed   By: Misty Stanley M.D.   On: 01/24/2021 11:18   US Venous Img Lower Bilateral (DVT)  Result Date: 01/24/2021 CLINICAL DATA:  Bilateral leg pain for 1 day EXAM: BILATERAL LOWER EXTREMITY VENOUS DOPPLER ULTRASOUND TECHNIQUE: Gray-scale sonography with compression, as well as color and duplex ultrasound, were performed to evaluate the deep venous system(s) from the level of the common femoral vein through the popliteal and proximal calf veins. COMPARISON:  None. FINDINGS: VENOUS Normal compressibility of the common femoral, superficial femoral, and popliteal veins, as well as the visualized calf veins. Visualized portions of profunda femoral vein and great saphenous vein unremarkable. No filling defects to suggest DVT on grayscale or color Doppler imaging. Doppler waveforms show normal direction of venous flow, normal respiratory plasticity and response to augmentation. OTHER None. Limitations: none IMPRESSION: Negative. Electronically Signed   By: Miachel Roux M.D.   On: 01/24/2021 10:11   DG Chest Port 1 View  Result Date: 01/23/2021  CLINICAL DATA:  80 year old male with shortness of breath. EXAM: PORTABLE CHEST 1 VIEW  COMPARISON:  Chest radiograph and CT dated 01/16/2011. The CT images are not available for direct comparison. FINDINGS: There is a 3.8 cm nodule in the left upper lobe which has increased in size since the prior radiograph. Further evaluation with CT is recommended. There is mild diffuse reticulonodular densities primarily in the left lung which may represent atelectasis, atypical pneumonia, or less likely asymmetric edema. Clinical correlation recommended. No lobar consolidation, pleural effusion, pneumothorax. Stable cardiomegaly. No acute osseous pathology. IMPRESSION: 1. Left upper lobe nodule, increased in size since the prior radiograph. Further evaluation with CT is recommended. 2. Diffuse reticulonodular densities in the left lung may represent atelectasis, atypical pneumonia, or less likely asymmetric edema. Electronically Signed   By: Anner Crete M.D.   On: 01/23/2021 16:52               LOS: 5 days   Lekeisha Arenas  [facility]   Pager on [email or web address]. If 7PM-7AM, please contact night-coverage at [email or web address]     01/24/2021, 3:51 PM

## 2021-01-25 LAB — BASIC METABOLIC PANEL
Anion gap: 7 (ref 5–15)
BUN: 18 mg/dL (ref 8–23)
CO2: 25 mmol/L (ref 22–32)
Calcium: 9.2 mg/dL (ref 8.9–10.3)
Chloride: 105 mmol/L (ref 98–111)
Creatinine, Ser: 2.38 mg/dL — ABNORMAL HIGH (ref 0.61–1.24)
GFR, Estimated: 27 mL/min — ABNORMAL LOW (ref >60)
Glucose, Bld: 103 mg/dL — ABNORMAL HIGH (ref 70–99)
Potassium: 3.5 mmol/L (ref 3.5–5.1)
Sodium: 137 mmol/L (ref 135–145)

## 2021-01-25 LAB — CBC WITH DIFFERENTIAL/PLATELET
Abs Immature Granulocytes: 0.03 10*3/uL (ref 0.00–0.07)
Basophils Absolute: 0 10*3/uL (ref 0.0–0.1)
Basophils Relative: 1 %
Eosinophils Absolute: 0.2 10*3/uL (ref 0.0–0.5)
Eosinophils Relative: 2 %
HCT: 26.1 % — ABNORMAL LOW (ref 39.0–52.0)
Hemoglobin: 7.9 g/dL — ABNORMAL LOW (ref 13.0–17.0)
Immature Granulocytes: 0 %
Lymphocytes Relative: 16 %
Lymphs Abs: 1.3 10*3/uL (ref 0.7–4.0)
MCH: 26.5 pg (ref 26.0–34.0)
MCHC: 30.3 g/dL (ref 30.0–36.0)
MCV: 87.6 fL (ref 80.0–100.0)
Monocytes Absolute: 0.7 10*3/uL (ref 0.1–1.0)
Monocytes Relative: 9 %
Neutro Abs: 6 10*3/uL (ref 1.7–7.7)
Neutrophils Relative %: 72 %
Platelets: 220 10*3/uL (ref 150–400)
RBC: 2.98 MIL/uL — ABNORMAL LOW (ref 4.22–5.81)
RDW: 15.7 % — ABNORMAL HIGH (ref 11.5–15.5)
WBC: 8.2 10*3/uL (ref 4.0–10.5)
nRBC: 0 % (ref 0.0–0.2)

## 2021-01-25 MED ORDER — AMOXICILLIN-POT CLAVULANATE 500-125 MG PO TABS: 1.0000 | ORAL_TABLET | Freq: Two times a day (BID) | ORAL | 0 refills | Status: DC

## 2021-01-25 NOTE — Discharge Summary (Addendum)
Physician Discharge Summary  Gregory Ramos ZTI:[id number] DOB: 1941/03/31 DOA: 01/19/2021  PCP: Gregory Noble, MD  Admit date: 01/19/2021 Discharge date: 01/26/2021  Discharge disposition: Home   Recommendations for Outpatient Follow-Up:   Follow-up with cardiologist on 02/07/2021. Follow-up with Dr. Rogue Ramos, oncologist, within 1 week of discharge  Discharge Diagnosis:   Principal Problem:   Symptomatic anemia Active Problems:   Essential hypertension   Stage 3b chronic kidney disease ([place])   Hematochezia   Hypoxia   Vomiting   Iron deficiency anemia due to chronic blood loss   Pulmonary nodule 1 cm or greater in diameter, left upper lobe   AV block, Mobitz 1   Severe sepsis (HCC)   Pneumonia   Colon cancer ([place])    Discharge Condition: Stable.  Diet recommendation:  Diet Order            Diet - low sodium heart healthy                   Code Status: Prior     Hospital Course:   [name] is a 80 year old man with medical history significant for hypertension, hyperlipidemia, enlarged prostate, sleep apnea, CKD stage IIIb, who presented to the hospital with vomiting, lightheadedness and bloody stools.  He was slightly hypoxic in the emergency room.  He was found to have symptomatic iron deficiency anemia, hematochezia, first-degree AV block and Mobitz 1 type II second-degree AV block.  He was given 3 doses of IV iron infusion.  He was taking metoprolol prior to admission and this was discontinued and heart block resolved.  Zio patch cardiac monitor was placed prior to discharge to monitor for arrhythmias in the outpatient setting.  He underwent colonoscopy and EGD and findings were significant for colon mass.  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  Hospital course was complicated by severe sepsis secondary to pneumonia complicated by acute hypoxic respiratory failure.  He was treated  with empiric IV antibiotics and IV fluids. Other findings on CT scan of the chest include bilateral adrenal adenomas, bilateral renal lesion suspicious for renal cysts, low-density lesions in the spleen measuring up to 3.4 cm, lung nodules.  Outpatient follow-up with PCP and oncologist was recommended.  His condition has improved and he is deemed stable for discharge to home.  Discharge plan was discussed with the patient and his wife at the bedside.    Medical Consultants:    Cardiologist  Gastroenterologist   Discharge Exam:    Vitals:   01/24/21 1603 01/24/21 2029 01/25/21 0518 01/25/21 0940  BP: 110/66 112/68 131/71 (!) 143/72  Pulse: 76 80 73 78  Resp: 16 18 17 16   Temp: 97.9 F (36.6 C) 98.2 F (36.8 C) 98.1 F (36.7 C) 98.2 F (36.8 C)  TempSrc: Oral   Oral  SpO2: 100% 100% 97% 96%  Weight:      Height:         GEN: NAD SKIN: Warm and dry EYES: EOMI ENT: MMM CV: RRR PULM: CTA B ABD: soft, obese, NT, +BS CNS: AAO x 3, non focal EXT: No edema or tenderness   The results of significant diagnostics from this hospitalization (including imaging, microbiology, ancillary and laboratory) are listed below for reference.     Procedures and Diagnostic Studies:   CT CHEST WO CONTRAST  Result Date: 01/24/2021 CLINICAL DATA:  Pneumonia. EXAM: CT CHEST WITHOUT CONTRAST TECHNIQUE: Multidetector CT imaging of the chest was performed following the standard  protocol without IV contrast. COMPARISON:  01/16/2011 FINDINGS: Cardiovascular: Heart size upper normal. Coronary artery calcification is evident. Atherosclerotic calcification is noted in the wall of the thoracic aorta. Enlargement of the pulmonary outflow tract and main pulmonary arteries suggests pulmonary arterial hypertension. Mediastinum/Nodes: No mediastinal lymphadenopathy. Heterogeneous thyroid parenchyma without a discrete nodule or mass evident. No evidence for gross hilar lymphadenopathy although assessment is  limited by the lack of intravenous contrast on today's study. The esophagus has normal imaging features. There is no axillary lymphadenopathy. Lungs/Pleura: Patchy and nodular airspace disease is seen in the central left upper lobe extending out to the lateral pleura. There is some associated volume loss/atelectasis in the posterior left upper lobe. Posterior left upper lobe nodule measuring 1.9 cm on image 45/3 was 1.6 cm when remeasured in a similar fashion on the prior study. 7 mm right perifissural nodule on 86/3 is new in the interval. There is new right base collapse/consolidation with stable 6 mm right lower lobe paraspinal nodule on 84/3. 4 mm left lower lobe nodule on 96/3 was 5 mm previously (remeasured). Tiny bilateral pleural effusions evident. Upper Abdomen: Similar appearance 1.9 cm water density lesion in the dome of the liver, likely a cyst. Multiple cystic lesions in both kidneys again noted, progressive in the interval and incompletely visualized on this noncontrast study. New low-density lesions in the spleen measure 2.7 in 3.4 cm respectively, new since 20/12. Similar appearance of bilateral adrenal adenomas. Musculoskeletal: Small sclerotic focus posterior right second rib is stable consistent with benign etiology. No worrisome lytic or sclerotic osseous abnormality. IMPRESSION: 1. New patchy and nodular airspace disease in the central left upper lobe compatible with infectious/inflammatory etiology. 2. New posterior left upper lobe and right base collapse/consolidation with new tiny bilateral pleural effusions. 3. 1.9 cm well-defined nodule in the posterior left upper lobe has increased from 1.6 cm on the study from 2012. Given the relatively minimal change over such a long time frame, this is most compatible with a benign etiology. 4. Interval development of low-density lesions in the spleen measuring up to 3.4 cm respectively. These are new since 20/12/202 and while likely benign, MRI abdomen  with and without contrast might be able to provide some additional characterization, as clinically warranted. 5. Similar appearance of bilateral adrenal adenomas. 6. Interval progression of low-density bilateral renal lesions, incompletely characterized on this noncontrast study. These were present on prior exams from 2012 and 2014 suggesting renal cysts. 7. Aortic Atherosclerosis (ICD10-I70.0). Electronically Signed   By: Misty Stanley M.D.   On: 01/24/2021 11:18   US Venous Img Lower Bilateral (DVT)  Result Date: 01/24/2021 CLINICAL DATA:  Bilateral leg pain for 1 day EXAM: BILATERAL LOWER EXTREMITY VENOUS DOPPLER ULTRASOUND TECHNIQUE: Gray-scale sonography with compression, as well as color and duplex ultrasound, were performed to evaluate the deep venous system(s) from the level of the common femoral vein through the popliteal and proximal calf veins. COMPARISON:  None. FINDINGS: VENOUS Normal compressibility of the common femoral, superficial femoral, and popliteal veins, as well as the visualized calf veins. Visualized portions of profunda femoral vein and great saphenous vein unremarkable. No filling defects to suggest DVT on grayscale or color Doppler imaging. Doppler waveforms show normal direction of venous flow, normal respiratory plasticity and response to augmentation. OTHER None. Limitations: none IMPRESSION: Negative. Electronically Signed   By: Miachel Roux M.D.   On: 01/24/2021 10:11   DG Chest Port 1 View  Result Date: 01/23/2021 CLINICAL DATA:  80 year old male with shortness  of breath. EXAM: PORTABLE CHEST 1 VIEW COMPARISON:  Chest radiograph and CT dated 01/16/2011. The CT images are not available for direct comparison. FINDINGS: There is a 3.8 cm nodule in the left upper lobe which has increased in size since the prior radiograph. Further evaluation with CT is recommended. There is mild diffuse reticulonodular densities primarily in the left lung which may represent atelectasis,  atypical pneumonia, or less likely asymmetric edema. Clinical correlation recommended. No lobar consolidation, pleural effusion, pneumothorax. Stable cardiomegaly. No acute osseous pathology. IMPRESSION: 1. Left upper lobe nodule, increased in size since the prior radiograph. Further evaluation with CT is recommended. 2. Diffuse reticulonodular densities in the left lung may represent atelectasis, atypical pneumonia, or less likely asymmetric edema. Electronically Signed   By: Anner Crete M.D.   On: 01/23/2021 16:52     Labs:   Basic Metabolic Panel: Recent Labs  Lab 01/20/21 0931 01/21/21 0527 01/21/21 0527 01/23/21 0603 01/23/21 1706 01/24/21 0829 01/25/21 0818  NA  --  140  --  138 139 138 137  K  --  4.0   < > 3.8 4.4 4.2 3.5  CL  --  107  --  105 105 105 105  CO2  --  28  --  26 26 26 25   GLUCOSE  --  96  --  109* 95 109* 103*  BUN  --  24*  --  14 13 15 18   CREATININE  --  2.10*  --  1.78* 1.80* 2.06* 2.38*  CALCIUM  --  9.4  --  9.8 9.9 9.2 9.2  MG 2.1 2.1  --   --   --   --   --    < > = values in this interval not displayed.   GFR Estimated Creatinine Clearance: 31.2 mL/min (A) (by C-G formula based on SCr of 2.38 mg/dL (H)). Liver Function Tests: No results for input(s): AST, ALT, ALKPHOS, BILITOT, PROT, ALBUMIN in the last 168 hours. No results for input(s): LIPASE, AMYLASE in the last 168 hours. No results for input(s): AMMONIA in the last 168 hours. Coagulation profile No results for input(s): INR, PROTIME in the last 168 hours.  CBC: Recent Labs  Lab 01/21/21 0527 01/22/21 0914 01/23/21 0603 01/23/21 1706 01/23/21 2211 01/24/21 0829 01/25/21 0818  WBC 6.3 7.1 5.9 12.4*  --  12.8* 8.2  NEUTROABS 4.1  --   --  10.9*  --  10.8* 6.0  HGB 7.9* 8.8* 7.9* 9.2* 8.2* 8.1* 7.9*  HCT 26.4* 28.9* 25.8* 30.5* 27.1* 26.1* 26.1*  MCV 87.4 85.8 85.4 88.2  --  86.7 87.6  PLT 257 270 242 277  --  232 220   Cardiac Enzymes: No results for input(s): CKTOTAL, CKMB,  CKMBINDEX, TROPONINI in the last 168 hours. BNP: Invalid input(s): POCBNP CBG: No results for input(s): GLUCAP in the last 168 hours. D-Dimer No results for input(s): DDIMER in the last 72 hours. Hgb A1c No results for input(s): HGBA1C in the last 72 hours. Lipid Profile No results for input(s): CHOL, HDL, LDLCALC, TRIG, CHOLHDL, LDLDIRECT in the last 72 hours. Thyroid function studies No results for input(s): TSH, T4TOTAL, T3FREE, THYROIDAB in the last 72 hours.  Invalid input(s): FREET3 Anemia work up No results for input(s): VITAMINB12, FOLATE, FERRITIN, TIBC, IRON, RETICCTPCT in the last 72 hours. Microbiology Recent Results (from the past 240 hour(s))  Resp Panel by RT-PCR (Flu A&B, Covid) Nasopharyngeal Swab     Status: None   Collection Time: 01/19/21  7:25 PM   Specimen: Nasopharyngeal Swab; Nasopharyngeal(NP) swabs in vial transport medium  Result Value Ref Range Status   SARS Coronavirus 2 by RT PCR NEGATIVE NEGATIVE Final    Comment: (NOTE) SARS-CoV-2 target nucleic acids are NOT DETECTED.  The SARS-CoV-2 RNA is generally detectable in upper respiratory specimens during the acute phase of infection. The lowest concentration of SARS-CoV-2 viral copies this assay can detect is 138 copies/mL. A negative result does not preclude SARS-Cov-2 infection and should not be used as the sole basis for treatment or other patient management decisions. A negative result may occur with  improper specimen collection/handling, submission of specimen other than nasopharyngeal swab, presence of viral mutation(s) within the areas targeted by this assay, and inadequate number of viral copies(<138 copies/mL). A negative result must be combined with clinical observations, patient history, and epidemiological information. The expected result is Negative.  Fact Sheet for Patients:  EntrepreneurPulse.com.au  Fact Sheet for Healthcare Providers:   IncredibleEmployment.be  This test is no t yet approved or cleared by the [place] FDA and  has been authorized for detection and/or diagnosis of SARS-CoV-2 by FDA under an Emergency Use Authorization (EUA). This EUA will remain  in effect (meaning this test can be used) for the duration of the COVID-19 declaration under Section 564(b)(1) of the Act, 21 U.S.C.section 360bbb-3(b)(1), unless the authorization is terminated  or revoked sooner.       Influenza A by PCR NEGATIVE NEGATIVE Final   Influenza B by PCR NEGATIVE NEGATIVE Final    Comment: (NOTE) The Xpert Xpress SARS-CoV-2/FLU/RSV plus assay is intended as an aid in the diagnosis of influenza from Nasopharyngeal swab specimens and should not be used as a sole basis for treatment. Nasal washings and aspirates are unacceptable for Xpert Xpress SARS-CoV-2/FLU/RSV testing.  Fact Sheet for Patients: EntrepreneurPulse.com.au  Fact Sheet for Healthcare Providers: IncredibleEmployment.be  This test is not yet approved or cleared by the [place] FDA and has been authorized for detection and/or diagnosis of SARS-CoV-2 by FDA under an Emergency Use Authorization (EUA). This EUA will remain in effect (meaning this test can be used) for the duration of the COVID-19 declaration under Section 564(b)(1) of the Act, 21 U.S.C. section 360bbb-3(b)(1), unless the authorization is terminated or revoked.  Performed at [facility], [address] [place], [place] [address]   CULTURE, BLOOD (ROUTINE X 2) w Reflex to ID Panel     Status: None (Preliminary result)   Collection Time: 01/23/21  6:58 PM   Specimen: BLOOD  Result Value Ref Range Status   Specimen Description BLOOD LEFT ANTECUBITAL  Final   Special Requests   Final    BOTTLES DRAWN AEROBIC AND ANAEROBIC Blood Culture adequate volume   Culture   Final    NO GROWTH 3 DAYS Performed at [facility], [address], [place], [place] [address]    Report Status PENDING  Incomplete  CULTURE, BLOOD (ROUTINE X 2) w Reflex to ID Panel     Status: None (Preliminary result)   Collection Time: 01/23/21  7:03 PM   Specimen: BLOOD  Result Value Ref Range Status   Specimen Description BLOOD RIGHT ANTECUBITAL  Final   Special Requests   Final    BOTTLES DRAWN AEROBIC AND ANAEROBIC Blood Culture adequate volume   Culture   Final    NO GROWTH 3 DAYS Performed at [facility], [address], [place], [place] [address]    Report Status PENDING  Incomplete  MRSA PCR Screening     Status: None   Collection Time: 01/24/21  3:30 PM   Specimen: Nasopharyngeal  Result Value Ref Range Status   MRSA by PCR NEGATIVE NEGATIVE Final    Comment:        The GeneXpert MRSA Assay (FDA approved for NASAL specimens only), is one component of a comprehensive MRSA colonization surveillance program. It is not intended to diagnose MRSA infection nor to guide or monitor treatment for MRSA infections. Performed at [facility], [address] [place], [place] [address]      Discharge Instructions:   Discharge Instructions    AMB  Referral to Pulmonary Nodule Clinic   Complete by: As directed    Diet - low sodium heart healthy   Complete by: As directed    Increase activity slowly   Complete by: As directed      Allergies as of 01/25/2021   No Known Allergies     Medication List    STOP taking these medications   ciprofloxacin 500 MG tablet Commonly known as: Cipro   HYDROcodone-acetaminophen 5-325 MG tablet Commonly known as: Norco   metoprolol tartrate 50 MG tablet Commonly known as: LOPRESSOR   senna 8.6 MG Tabs tablet Commonly known as: SENOKOT     TAKE these medications   amLODipine 5 MG tablet Commonly known as: NORVASC Take 5 mg by mouth daily.   amoxicillin-clavulanate 500-125 MG tablet Commonly known as: Augmentin Take 1 tablet (500 mg total) by  mouth 2 (two) times daily.   atorvastatin 80 MG tablet Commonly known as: LIPITOR Take 80 mg by mouth daily.   lisinopril-hydrochlorothiazide 20-25 MG tablet Commonly known as: ZESTORETIC Take 1 tablet by mouth daily at 12 noon.   omeprazole 20 MG capsule Commonly known as: PRILOSEC Take 20 mg by mouth daily.       Follow-up Information    Schedule an appointment as soon as possible for a visit with Cammie Sickle, MD.   Specialties: Internal Medicine, Oncology Why: Patient will need to get a referal from his PCP. Contact information: [place] [address] [phone]        Reform. Schedule an appointment as soon as possible for a visit on 02/07/2021.   Specialty: Cardiology Why: @ 3pm Contact information: [address] [place] [phone]               Time coordinating discharge: 32 minutes  Signed:  Jennye Boroughs  [facility] 01/26/2021, 9:55 PM   Pager on [email or web address]. If 7PM-7AM, please contact night-coverage at [email or web address]

## 2021-01-25 NOTE — Care Management Important Message (Signed)
Important Message  Patient Details  Name: Gregory Ramos MRN: [id number] Date of Birth: 22-Jun-1941   Medicare Important Message Given:  No   Patient discharged prior to arrival to unit to deliver concurrent Medicare IM.     Dannette Barbara 01/25/2021, 11:43 AM

## 2021-01-25 NOTE — Plan of Care (Signed)
  Problem: Health Behavior/Discharge Planning: Goal: Ability to manage health-related needs will improve Outcome: Progressing   Problem: Clinical Measurements: Goal: Ability to maintain clinical measurements within normal limits will improve Outcome: Progressing   Problem: Clinical Measurements: Goal: Will remain free from infection Note: Possible phlebitis in right upper extremity. Patient's arm is warm and edematous with minimal pain.

## 2021-01-25 NOTE — Progress Notes (Signed)
Patient being discharged home today. Vitals WDL at time of discharge. RN Stacie Glaze completed patient education and discharge instructions. All questions answered at this time. Wife at bedside. Family member arriving to take patient home.

## 2021-01-26 ENCOUNTER — Telehealth: Payer: Self-pay | Admitting: Internal Medicine

## 2021-01-26 NOTE — Telephone Encounter (Signed)
M- please schedule appt on 5/05-Thursday at 11:15 am dx: clon cancer.[new]  GB

## 2021-01-28 LAB — CULTURE, BLOOD (ROUTINE X 2)
Culture: NO GROWTH
Culture: NO GROWTH
Special Requests: ADEQUATE
Special Requests: ADEQUATE

## 2021-01-30 ENCOUNTER — Encounter: Payer: Self-pay | Admitting: *Deleted

## 2021-01-31 ENCOUNTER — Inpatient Hospital Stay: Payer: Medicare Other

## 2021-01-31 ENCOUNTER — Inpatient Hospital Stay: Payer: Medicare Other | Admitting: Internal Medicine

## 2021-02-02 NOTE — Progress Notes (Deleted)
Cardiology Office Note    Date:  02/02/2021   ID:  Gregory Ramos, DOB [birth year], MRN [id number]  PCP:  Marden Noble, MD  Cardiologist:  Kathlyn Sacramento, MD  Electrophysiologist:  None   Chief Complaint: Hospital follow-up  History of Present Illness:   Gregory Ramos is a 80 y.o. male with history of recently diagnosed colon cancer, Mobitz type I AV block, symptomatic iron deficiency anemia, CKD stage III, HTN, HLD, and enlarged prostate who presents for hospital follow-up as outlined below.  He was evaluated by [facility] cardiology in 08/2019 for preoperative evaluation for planned total hip replacement.  No cardiac testing was indicated at that time.  He was recently admitted to the hospital from 4/23 through 4/30 after presenting with vomiting, lightheadedness, and bloody stools and was found to have symptomatic iron deficiency anemia with hematochezia.  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  Hospital course was complicated by severe sepsis secondary to pneumonia complicated by acute hypoxic respiratory failure.  CT imaging also demonstrated bilateral adrenal adenomas, bilateral renal lesions suspicious for renal cysts, low-density lesions in the spleen, and pulmonary nodules.  Cardiology was asked to evaluate for first-degree AV block with Mobitz type I second-degree AV block and possible brief episode of Mobitz type II AV block in the setting of treatment with metoprolol.  With discontinuation of metoprolol he maintained sinus rhythm with a first-degree AV block.  There was no indication for PPM implantation during his admission.  BNP 56.7.  Echo showed an EF of 60 to 65%, no regional wall motion abnormalities, grade 1 diastolic dysfunction, normal RV systolic function and ventricular cavity size, moderately elevated PASP  estimated at 51.8 mmHg, moderately dilated left atrium, and mild mitral regurgitation.  Outpatient cardiac monitoring was applied and is pending at this time.  ***   Labs independently reviewed: 12/2020 - Hgb 7.9, PLT 220, potassium 3.5, BUN 18, serum creatinine 2.38, magnesium 2.1, TSH normal, albumin 3.5, AST/ALT not elevated 08/2019 - A1c 6.1 02/2013 - TC 162, TG 68, HDL 40, LDL 108  Past Medical History:  Diagnosis Date  . Hyperlipidemia   . Hypertension   . Prostate enlargement   . Sleep apnea    resolved with weight loss    Past Surgical History:  Procedure Laterality Date  . COLONOSCOPY N/A 02/20/2015   Procedure: COLONOSCOPY;  Surgeon: Robert Bellow, MD;  Location: [facility] ENDOSCOPY;  Service: Endoscopy;  Laterality: N/A;  . COLONOSCOPY WITH PROPOFOL N/A 01/23/2021   Procedure: COLONOSCOPY WITH PROPOFOL;  Surgeon: Lin Landsman, MD;  Location: [facility] ENDOSCOPY;  Service: Gastroenterology;  Laterality: N/A;  . CYST REMOVAL HAND  02725  . ESOPHAGOGASTRODUODENOSCOPY (EGD) WITH PROPOFOL N/A 01/23/2021   Procedure: ESOPHAGOGASTRODUODENOSCOPY (EGD) WITH PROPOFOL;  Surgeon: Lin Landsman, MD;  Location: [facility];  Service: Gastroenterology;  Laterality: N/A;  . JOINT REPLACEMENT     hip  . PROSTATE BIOPSY  2000?  Marland Kitchen XI ROBOTIC ASSISTED SIMPLE PROSTATECTOMY N/A 01/14/2017   Procedure: XI ROBOTIC ASSISTED SIMPLE PROSTATECTOMY;  Surgeon: Alexis Frock, MD;  Location: [facility] ORS;  Service: Urology;  Laterality: N/A;    Current Medications: No outpatient medications have been marked as taking for the 02/07/21 encounter (Appointment) with Rise Mu, PA-C.    Allergies:   Iodinated diagnostic agents   Social History   Socioeconomic History  . Marital status: Married  Spouse name: Not on file  . Number of children: Not on file  . Years of education: Not on file  . Highest education level: Not on file  Occupational History  . Not on file  Tobacco Use  . Smoking  status: Former Smoker    Packs/day: 1.00    Years: 15.00    Pack years: 15.00    Types: Cigarettes  . Smokeless tobacco: Never Used  Substance and Sexual Activity  . Alcohol use: No    Alcohol/week: 0.0 standard drinks  . Drug use: No  . Sexual activity: Not on file  Other Topics Concern  . Not on file  Social History Narrative   Retired from [employer].  Lives at home wife.     Social Determinants of Health   Financial Resource Strain: Not on file  Food Insecurity: Not on file  Transportation Needs: Not on file  Physical Activity: Not on file  Stress: Not on file  Social Connections: Not on file     Family History:  The patient's family history includes Cancer in his father.  ROS:   ROS   EKGs/Labs/Other Studies Reviewed:    Studies reviewed were summarized above. The additional studies were reviewed today:  2D echo 01/20/2021: 1. Left ventricular ejection fraction, by estimation, is 60 to 65%. The  left ventricle has normal function. The left ventricle has no regional  wall motion abnormalities. Left ventricular diastolic parameters are  consistent with Grade I diastolic  dysfunction (impaired relaxation).  2. Right ventricular systolic function is normal. The right ventricular  size is normal. There is moderately elevated pulmonary artery systolic  pressure. The estimated right ventricular systolic pressure is 32.9 mmHg.  3. Left atrial size was moderately dilated.  4. The mitral valve is normal in structure. Mild mitral valve  Regurgitation. __________  Elwyn Reach patch 12/2020: Pending __________  2D echo 02/2013:  1. Left ventricular ejection fraction, by visual estimation, is 55 to  60%.   2. Normal global left ventricular systolic function.   3. Mild mitral valve regurgitation.   4. Severely increased left ventricular posterior wall thickness.   5. Mild tricuspid regurgitation.    EKG:  EKG is ordered today.  The EKG ordered today  demonstrates ***  Recent Labs: 01/19/2021: ALT 9 01/20/2021: TSH 1.006 01/21/2021: Magnesium 2.1 01/23/2021: B Natriuretic Peptide 56.7 01/25/2021: BUN 18; Creatinine, Ser 2.38; Hemoglobin 7.9; Platelets 220; Potassium 3.5; Sodium 137  Recent Lipid Panel    Component Value Date/Time   CHOL 162 03/15/2013 0501   TRIG 68 03/15/2013 0501   HDL 40 03/15/2013 0501   VLDL 14 03/15/2013 0501   LDLCALC 108 (H) 03/15/2013 0501    PHYSICAL EXAM:    VS:  There were no vitals taken for this visit.  BMI: There is no height or weight on file to calculate BMI.  Physical Exam  Wt Readings from Last 3 Encounters:  01/24/21 225 lb 12 oz (102.4 kg)  01/14/17 247 lb (112 kg)  01/08/17 247 lb 12.8 oz (112.4 kg)     ASSESSMENT & PLAN:   1. First degree AV block/Mobitz type I AV block:  2. HFpEF/pulmonary hypertension:  3. Symptomatic iron deficiency anemia with recent diagnosis of colon cancer:  4. HTN: Blood pressure ***  5. HLD:  6. CKD stage III:  Disposition: F/u with Dr. Fletcher Anon or an APP in ***.   Medication Adjustments/Labs and Tests Ordered: Current medicines are reviewed at length with the patient today.  Concerns regarding medicines are outlined above. Medication changes, Labs and Tests ordered today are summarized above and listed in the Patient Instructions accessible in Encounters.   Signed, Christell Faith, PA-C 02/02/2021 11:05 AM     [facility] [address] [place] [address] [place], [place] [address] [phone]

## 2021-02-07 ENCOUNTER — Ambulatory Visit: Payer: Medicare Other | Admitting: Physician Assistant

## 2021-02-08 ENCOUNTER — Encounter: Payer: Self-pay | Admitting: Physician Assistant

## 2021-02-13 NOTE — Addendum Note (Signed)
Encounter addended by: Jeannette How on: 02/13/2021 12:20 PM  Actions taken: Imaging Exam ended

## 2021-02-21 ENCOUNTER — Telehealth: Payer: Self-pay

## 2021-02-21 DIAGNOSIS — R9431 Abnormal electrocardiogram [ECG] [EKG]: Secondary | ICD-10-CM

## 2021-02-21 NOTE — Telephone Encounter (Signed)
Was not able to reach pt regarding his recent Zio results, LDM on VM (DPR approved) ,Marrianne Mood D, PA-C had a chance to review his results and advised   Monitor showed  --Normal heart rhythm with some slower and faster heart rhythms.  --At times a slower conduction called 1st degree AV block that is not concerning and we will continue to monitor.  --At times a rhythm called Wenckebach, which is sometimes associated with sleep apnea.  --Occasional faster heart rhythm from the top part of the heart.  --Extra beats from the top and bottom part of the heart.  --Heart rate from 28bpm to 200bpm with average HR 71bpm   No concerning slow conductions, but given the ectopy and HR range, recommend EP referral.   Advised pt to call with any concerns or questions, otherwise expect a call from our office for the EP referral to have schedule with the EP provider to review Zio results and low HR.

## 2021-04-04 ENCOUNTER — Ambulatory Visit: Payer: Self-pay | Admitting: Urology

## 2021-09-10 IMAGING — DX DG CHEST 1V PORT
1 series · 1 of 1 positions shown · non-contrast
Comparison: Chest radiograph and CT dated 01/16/2011. The CT images
are not available for direct comparison.

CLINICAL DATA: 79-year-old male with shortness of breath.

EXAM:
PORTABLE CHEST 1 VIEW

[chest ap]
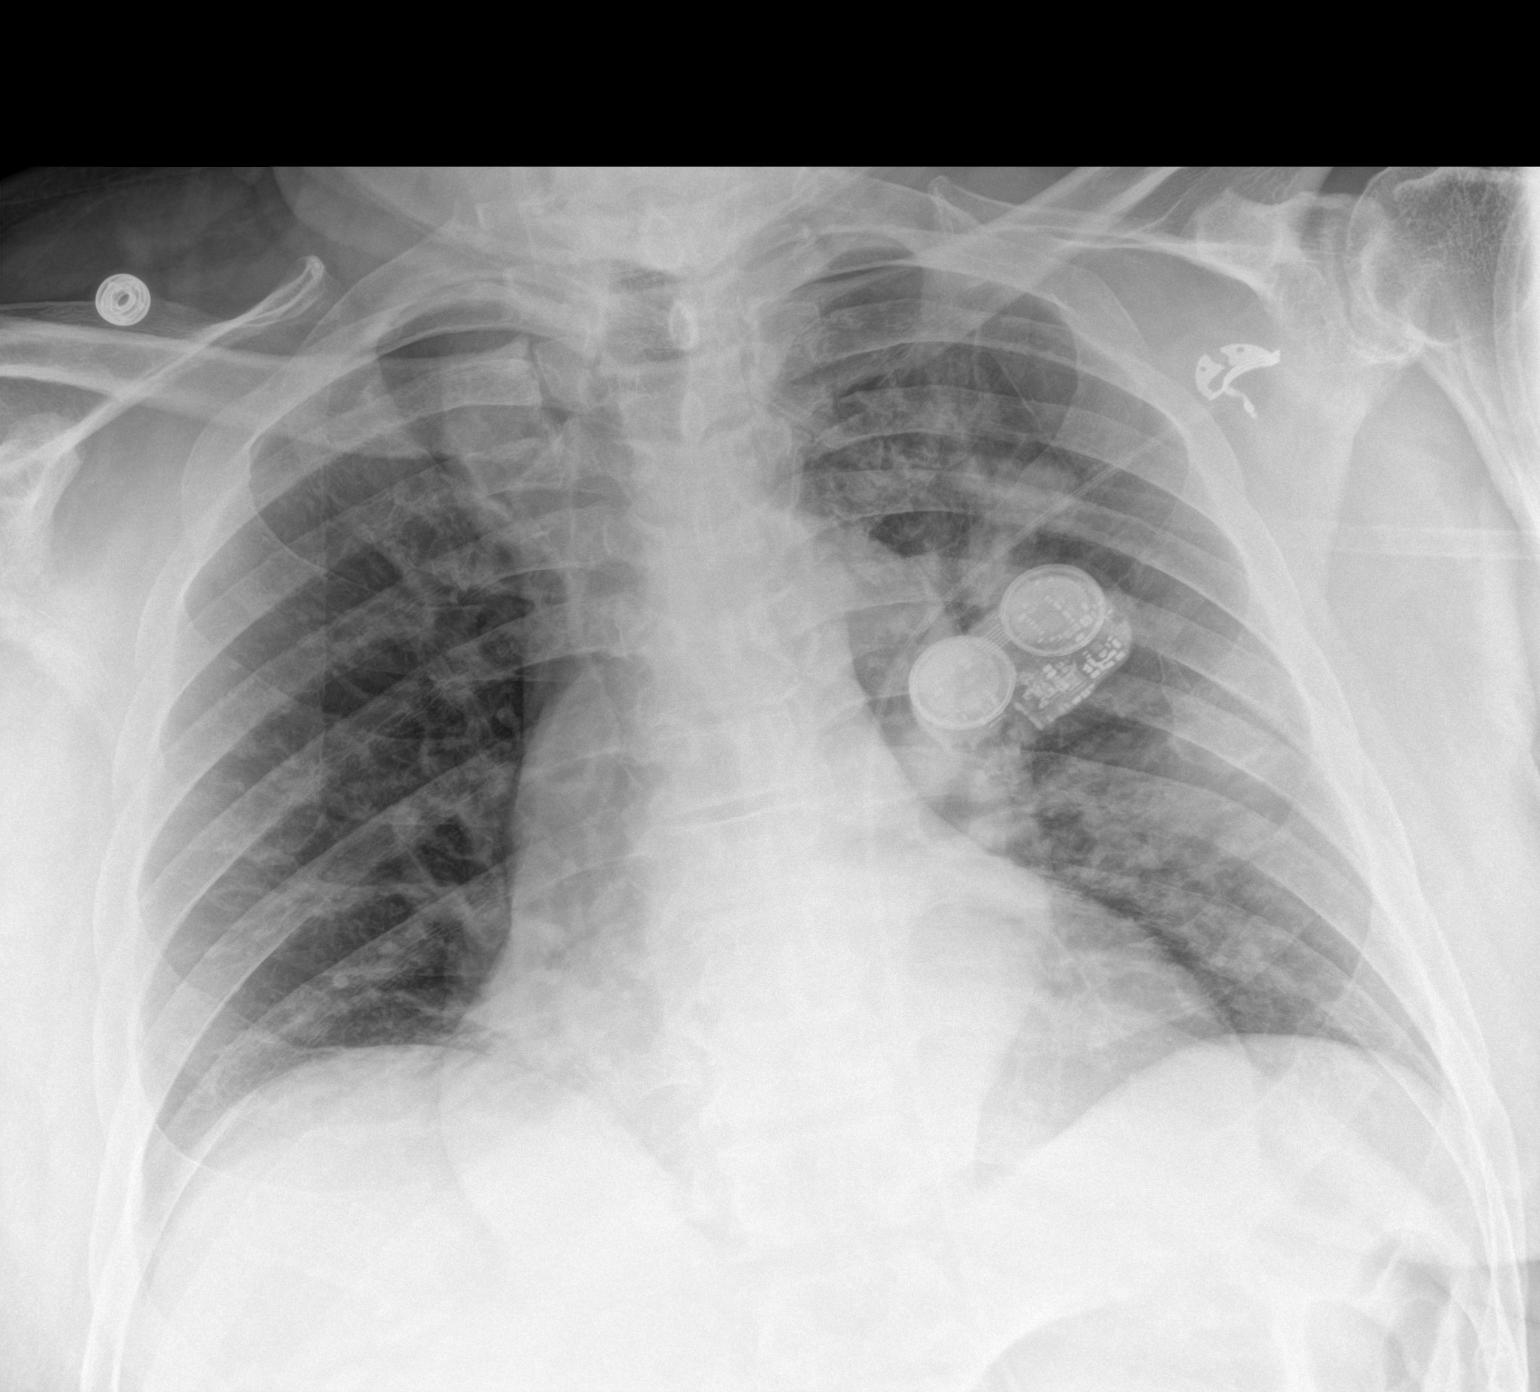

[1 of 1 positions shown; findings below may reference images not displayed]

FINDINGS: There is a 3.8 cm nodule in the left upper lobe which has increased
in size since the prior radiograph. Further evaluation with CT is
recommended. There is mild diffuse reticulonodular densities
primarily in the left lung which may represent atelectasis, atypical
pneumonia, or less likely asymmetric edema. Clinical correlation
recommended. No lobar consolidation, pleural effusion, pneumothorax.
Stable cardiomegaly. No acute osseous pathology.
IMPRESSION: 1. Left upper lobe nodule, increased in size since the prior
radiograph. Further evaluation with CT is recommended.
2. Diffuse reticulonodular densities in the left lung may represent
atelectasis, atypical pneumonia, or less likely asymmetric edema.

## 2022-03-04 ENCOUNTER — Other Ambulatory Visit: Payer: Self-pay

## 2022-03-04 ENCOUNTER — Emergency Department: Payer: No Typology Code available for payment source

## 2022-03-04 ENCOUNTER — Emergency Department
Admission: EM | Admit: 2022-03-04 | Discharge: 2022-03-04 | Disposition: A | Discharge status: Home / Self Care | Payer: No Typology Code available for payment source | Attending: Emergency Medicine | Admitting: Emergency Medicine

## 2022-03-04 ENCOUNTER — Encounter: Payer: Self-pay | Admitting: Emergency Medicine

## 2022-03-04 DIAGNOSIS — K429 Umbilical hernia without obstruction or gangrene: Secondary | ICD-10-CM | POA: Insufficient documentation

## 2022-03-04 DIAGNOSIS — Z85038 Personal history of other malignant neoplasm of large intestine: Secondary | ICD-10-CM | POA: Insufficient documentation

## 2022-03-04 DIAGNOSIS — R1030 Lower abdominal pain, unspecified: Secondary | ICD-10-CM | POA: Diagnosis present

## 2022-03-04 DIAGNOSIS — G8929 Other chronic pain: Secondary | ICD-10-CM

## 2022-03-04 DIAGNOSIS — R8271 Bacteriuria: Secondary | ICD-10-CM | POA: Diagnosis not present

## 2022-03-04 DIAGNOSIS — Z8546 Personal history of malignant neoplasm of prostate: Secondary | ICD-10-CM | POA: Diagnosis not present

## 2022-03-04 DIAGNOSIS — N189 Chronic kidney disease, unspecified: Secondary | ICD-10-CM | POA: Diagnosis not present

## 2022-03-04 LAB — COMPREHENSIVE METABOLIC PANEL
ALT: 11 U/L (ref 0–44)
AST: 10 U/L — ABNORMAL LOW (ref 15–41)
Albumin: 3 g/dL — ABNORMAL LOW (ref 3.5–5.0)
Alkaline Phosphatase: 73 U/L (ref 38–126)
Anion gap: 8 (ref 5–15)
BUN: 36 mg/dL — ABNORMAL HIGH (ref 8–23)
CO2: 21 mmol/L — ABNORMAL LOW (ref 22–32)
Calcium: 10.1 mg/dL (ref 8.9–10.3)
Chloride: 107 mmol/L (ref 98–111)
Creatinine, Ser: 2.49 mg/dL — ABNORMAL HIGH (ref 0.61–1.24)
GFR, Estimated: 26 mL/min — ABNORMAL LOW (ref >60)
Glucose, Bld: 109 mg/dL — ABNORMAL HIGH (ref 70–99)
Potassium: 4.5 mmol/L (ref 3.5–5.1)
Sodium: 136 mmol/L (ref 135–145)
Total Bilirubin: 0.5 mg/dL (ref 0.3–1.2)
Total Protein: 7.7 g/dL (ref 6.5–8.1)

## 2022-03-04 LAB — CBC
HCT: 32.7 % — ABNORMAL LOW (ref 39.0–52.0)
Hemoglobin: 10.3 g/dL — ABNORMAL LOW (ref 13.0–17.0)
MCH: 25.9 pg — ABNORMAL LOW (ref 26.0–34.0)
MCHC: 31.5 g/dL (ref 30.0–36.0)
MCV: 82.2 fL (ref 80.0–100.0)
Platelets: 294 10*3/uL (ref 150–400)
RBC: 3.98 MIL/uL — ABNORMAL LOW (ref 4.22–5.81)
RDW: 16.2 % — ABNORMAL HIGH (ref 11.5–15.5)
WBC: 7.1 10*3/uL (ref 4.0–10.5)
nRBC: 0 % (ref 0.0–0.2)

## 2022-03-04 LAB — URINALYSIS, ROUTINE W REFLEX MICROSCOPIC
Bilirubin Urine: NEGATIVE
Glucose, UA: NEGATIVE mg/dL
Hgb urine dipstick: NEGATIVE
Ketones, ur: NEGATIVE mg/dL
Nitrite: POSITIVE — AB
Protein, ur: NEGATIVE mg/dL
Specific Gravity, Urine: 1.01 (ref 1.005–1.030)
pH: 5 (ref 5.0–8.0)

## 2022-03-04 LAB — LIPASE, BLOOD: Lipase: 21 U/L (ref 11–51)

## 2022-03-04 MED ORDER — OXYCODONE-ACETAMINOPHEN 5-325 MG PO TABS: 1.0000 | ORAL_TABLET | Freq: Three times a day (TID) | ORAL | 0 refills | Status: AC | PRN

## 2022-03-04 MED ORDER — OXYCODONE-ACETAMINOPHEN 5-325 MG PO TABS
1.0000 | ORAL_TABLET | ORAL | Status: AC
  Administered 2022-03-04: 1 via ORAL
  Filled 2022-03-04: qty 1

## 2022-03-04 NOTE — ED Triage Notes (Addendum)
C/O left groin pain intermittently x 4-5 months.  States pain returned about 7-10 days ago. Seen by PCP last Wednesday for same and pain worsened 2 days ago.  Has been taking tylenol and toradol for pain, which has been helping.  States  pain will be 9-10 /10 and medication will reduce pain to 2/10.  Patient also states he had a UTI about 3 weeks ago and completed a course of antibiotics.

## 2022-03-04 NOTE — Discharge Instructions (Addendum)
STOP use of hydrocodone.  No driving within 8 hours of use of oxycodone (new pain medicine prescribed today).  Please do not mix use of oxycodone with any other prescription pain medicine or product that contains acetaminophen.  Please discard your previous prescription for hydrocodone  You were seen in the emergency room for abdominal pain. It is important that you follow up closely with your primary care doctor in the next couple of days.  We will take the urine sample you gave [place] and culture it.  If a concerning bacteria that could signify an ongoing infection is noted we will call you to start an antibiotic.  Please return to the emergency room right away if you are to develop a fever, pain with urination, have difficulty urinating, severe nausea, your pain becomes severe or worsens, you are unable to keep food down, begin vomiting any dark or bloody fluid, you develop any dark or bloody stools, feel dehydrated, or other new concerns or symptoms arise.

## 2022-03-04 NOTE — ED Provider Notes (Signed)
[facility] Provider Note    Event Date/Time   First MD Initiated Contact with Patient 03/04/22 [phone]     (approximate)   History   Groin Pain   HPI  Gregory Ramos is a 81 y.o. male who reports he has been having 3 to 4 months of abdominal pain.  He is low across his lower pelvis.  It is relieved by taking hydrocodone which helps for about 2 to 3 hours but then the pain will come back.  He has seen several doctors for this, he was diagnosed with a possible urinary tract infection and completed antibiotic for that at the end of May.  He continues to have pain.  Reports antibiotic did not help.  He is not having any fevers no chills no nausea or vomiting.  He has 1 bowel movement every couple of days which has been typical since his surgery.  Reports pain is ongoing thus far has not been able to find the cause.  He does have a hernia around his bellybutton but reports its not causing any issue  He also takes 1-2 Tylenol tablets every 6 hours in addition to 1 Vicodin tablet every 6 hours.  This helps relieve his pain. (Of note, educated patient on the fact that he is possibly exceeding the maximum dose of Tylenol, and he relates that he will stop using Tylenol product within 6 hours of use of hydrocodone/acetaminophen moving forward)  No abnormal urine odor.  No pain or burning with urination.  Denies pain in his rectal region.  Reviewed outside records from May 30 of this year patient has a history of CKD prostate cancer colon cancer prior colectomy, recently treated with cefdinir for possible UTI on May 16  Patient was seen for suprapubic pain at the ER May 30 at [facility].  Disposition listed as "eloped"   No fevers.  No chest pain no trouble breathing.  Pain has been ongoing for about 3 to 4 months and just seems to be slowly worsening on a daily basis.  Physical Exam   Triage Vital Signs: ED Triage Vitals  Enc Vitals Group     BP 03/04/22 0746 136/90      Pulse Rate 03/04/22 0746 81     Resp 03/04/22 0746 17     Temp 03/04/22 0746 98.1 F (36.7 C)     Temp Source 03/04/22 0746 Oral     SpO2 03/04/22 0746 100 %     Weight 03/04/22 0729 225 lb 12 oz (102.4 kg)     Height 03/04/22 0729 '5\' 11"'$  (1.803 m)     Head Circumference --      Peak Flow --      Pain Score 03/04/22 0729 8     Pain Loc --      Pain Edu? --      Excl. in [place]? --     Most recent vital signs: Vitals:   03/04/22 0930 03/04/22 1000  BP: (!) 148/72 (!) 168/86  Pulse: 73 80  Resp: 17 12  Temp:    SpO2: 100% 96%     General: Awake, no distress.  CV:  Good peripheral perfusion.  Resp:  Normal effort.  Clear lungs Abd:  No distention.  Left umbilical hernia present to the right lower abdomen, soft nontender.  Denies pain to palpation except reports a little bit of discomfort to palpation of the suprapubic very low, but otherwise no noted pain on exam of any  quadrant.  No rebound no guarding.  Prostate exam performed with nurse Bill, the patient denies pain to palpation of the prostate or rectum.  The perineum is normal. Other:  Alert well-oriented.  Strong pulses dorsalis pedis upper foot bilateral   ED Results / Procedures / Treatments   Labs (all labs ordered are listed, but only abnormal results are displayed) Labs Reviewed  COMPREHENSIVE METABOLIC PANEL - Abnormal; Notable for the following components:      Result Value   CO2 21 (*)    Glucose, Bld 109 (*)    BUN 36 (*)    Creatinine, Ser 2.49 (*)    Albumin 3.0 (*)    AST 10 (*)    GFR, Estimated 26 (*)    All other components within normal limits  CBC - Abnormal; Notable for the following components:   RBC 3.98 (*)    Hemoglobin 10.3 (*)    HCT 32.7 (*)    MCH 25.9 (*)    RDW 16.2 (*)    All other components within normal limits  URINALYSIS, ROUTINE W REFLEX MICROSCOPIC - Abnormal; Notable for the following components:   Color, Urine YELLOW (*)    APPearance CLEAR (*)    Nitrite POSITIVE (*)     Leukocytes,Ua TRACE (*)    Bacteria, UA MANY (*)    All other components within normal limits  URINE CULTURE  LIPASE, BLOOD     RADIOLOGY IMPRESSION:  1. No acute findings in the abdomen or pelvis. Specifically, no  findings to explain the patient's history of pelvic pain.  2. Status post right nephrectomy.  3. Multiple low-density left renal lesions, progressive since prior  study. These are not well characterized on noncontrast imaging but  measure near water density suggesting cyst. Patient had MRI abdomen  at [facility] on 12/05/2021 with report available in  epic Care everywhere.  4. Interval enlargement of hypoattenuating lesions in the dome of  the liver. These were characterized on a [facility] MRI  abdomen 12/05/2021 with report available in epic.  5. Right paraumbilical ventral hernia contains a short segment of  small bowel without bowel wall thickening or evidence of small-bowel  obstruction.  6. Abdominal aortic aneurysm measuring 3.2 cm diameter. Recommend  follow-up ultrasound every 3 years. This recommendation follows ACR  consensus guidelines: White Paper of the ACR Incidental Findings  Committee II on Vascular Findings. J Am Coll Radiol 2013;  10:789-794.  7. Prostatomegaly with prostate parenchyma deviated to the right on  today's study, new since 03/14/2013 and of indeterminate  significance.  8. Aortic Atherosclerosis (ICD10-I70.0).   Reviewed CT as above, no acute findings to noted.   PROCEDURES:  Critical Care performed: No  Procedures   MEDICATIONS ORDERED IN ED: Medications  oxyCODONE-acetaminophen (PERCOCET/ROXICET) 5-325 MG per tablet 1 tablet (1 tablet Oral Given 03/04/22 1005)     IMPRESSION / MDM / ASSESSMENT AND PLAN / ED COURSE  I reviewed the triage vital signs and the nursing notes.                              Differential diagnosis includes but is not limited to, abdominal perforation, aortic  dissection, cholecystitis, appendicitis, diverticulitis, colitis, esophagitis/gastritis, kidney stone, pyelonephritis, urinary tract infection, aortic aneurysm. All are considered in decision and treatment plan. Based upon the patient's presentation and risk factors, will obtain CT imaging to evaluate for etiologies such as abscess, perforation,  diverticular disease, nephrolithiasis, prostate lesion, bone lesion etc.  Limitation due to patient's chronic kidney disease.  His GFR appears to be at baseline compared with our previous GFR and labs about 1 year ago  He has been seen multiple specialist for this pain has been treated for possible UTI.  His urinalysis today is reviewed by me and does show some bacteria but he denies dysuria, no fever, new leukocytosis, no abnormal urine odor and reports his urination feels normal.    Patient reports he feels no difficulty in emptying his bladder.  Not feel like his bladder is blocked or distended  We will send his urine for culture at this point.  If culture positive would recommend tailored antibiotic treatment, but at this juncture I suspect best to culture and wait for result.  No evidence of sepsis.  Discussed with patient, will trial oxycodone for pain, may consider switching him to oxycodone away from hydrocodone.  Patient understands he may not use both hydrocodone or oxycodone at the same time around the same day     Patient's presentation is most consistent with severe exacerbation of chronic illness.  The patient is on the cardiac monitor to evaluate for evidence of arrhythmia and/or significant heart rate changes.   ----------------------------------------- 11:46 AM on 03/04/2022 ----------------------------------------- Patient's pain well controlled with oxycodone.  He reports he feels well.  Discussed with patient, and given he does not have clear symptoms of urinary tract infection we will culture his urine.  He is understanding of this  as well as careful return precautions.  He will get rid of his hydrocodone and knows not to take this or use within 8 hours of hydrocodone.  Also discussed medication safety with the patient.  I will prescribe the patient a narcotic pain medicine due to their condition which I anticipate will cause at least moderate pain short term. I discussed with the patient safe use of narcotic pain medicines, and that they are not to drive, work in dangerous areas, or ever take more than prescribed (no more than 1 pill every 8 hours). We discussed that this is the type of medication that can be  overdosed on and the risks of this type of medicine. Patient is very agreeable to only use as prescribed and to never use more than prescribed.  ----------------------------------------- 11:48 AM on 03/04/2022 ----------------------------------------- Patient alert well-oriented.  Reports pain is about a 2 out of 10 which is excellent for him over the last several months.  Return precautions and treatment recommendations and follow-up discussed with the patient who is agreeable with the plan.  Patient's wife will drive him, daughter with him today.     FINAL CLINICAL IMPRESSION(S) / ED DIAGNOSES   Final diagnoses:  Abdominal pain, chronic, bilateral lower quadrant     Rx / DC Orders   ED Discharge Orders          Ordered    oxyCODONE-acetaminophen (PERCOCET/ROXICET) 5-325 MG tablet  Every 8 hours PRN        03/04/22 1146             Note:  This document was prepared using Dragon voice recognition software and may include unintentional dictation errors.   Delman Kitten, MD 03/04/22 1148

## 2022-03-04 NOTE — ED Notes (Signed)
Bladder scan showed 254m of urine in pt's bladder.

## 2022-03-04 NOTE — ED Notes (Signed)
Pt c/o lower abd pain pain and tenderness to the scrotum, denies any painful urination or swelling to the groin. Pt currently has 6 months injection for the metastatic CA.

## 2022-03-06 LAB — URINE CULTURE: Culture: >=100000 — AB

## 2022-03-07 NOTE — Progress Notes (Signed)
ED Antimicrobial Stewardship Positive Culture Follow Up   Gregory Ramos is an 81 y.o. male who presented to [facility] on 03/04/2022 with a chief complaint of  Chief Complaint  Patient presents with   Groin Pain    Recent Results (from the past 720 hour(s))  Urine Culture     Status: Abnormal   Collection Time: 03/04/22  7:44 AM   Specimen: Urine, Clean Catch  Result Value Ref Range Status   Specimen Description   Final    URINE, CLEAN CATCH Performed at [facility], [address], [place], [place] [address]    Special Requests   Final    NONE Performed at [facility], [address], [place], [place] [address]    Culture >=100,000 COLONIES/mL ESCHERICHIA COLI (A)  Final   Report Status 03/06/2022 FINAL  Final   Organism ID, Bacteria ESCHERICHIA COLI (A)  Final      Susceptibility   Escherichia coli - MIC*    AMPICILLIN >=32 RESISTANT Resistant     CEFAZOLIN <=4 SENSITIVE Sensitive     CEFEPIME <=0.12 SENSITIVE Sensitive     CEFTRIAXONE <=0.25 SENSITIVE Sensitive     CIPROFLOXACIN >=4 RESISTANT Resistant     GENTAMICIN <=1 SENSITIVE Sensitive     IMIPENEM <=0.25 SENSITIVE Sensitive     NITROFURANTOIN <=16 SENSITIVE Sensitive     TRIMETH/SULFA >=320 RESISTANT Resistant     AMPICILLIN/SULBACTAM >=32 RESISTANT Resistant     PIP/TAZO 16 SENSITIVE Sensitive     * >=100,000 COLONIES/mL ESCHERICHIA COLI     '[x]'$  Patient discharged originally without antimicrobial agent and treatment is now indicated  New antibiotic prescription: Cephalexin '500mg'$  PO BID x 7d (adjusted for renal function)  ED Provider: Nickie Retort, PharmD, BCPS, BCIDP Work Cell: [phone] 03/07/2022 1:18 PM

## 2022-04-06 ENCOUNTER — Other Ambulatory Visit: Payer: Self-pay

## 2022-04-06 ENCOUNTER — Emergency Department
Admission: EM | Admit: 2022-04-06 | Discharge: 2022-04-06 | Disposition: A | Discharge status: Home / Self Care | Payer: No Typology Code available for payment source | Attending: Emergency Medicine | Admitting: Emergency Medicine

## 2022-04-06 ENCOUNTER — Emergency Department: Payer: No Typology Code available for payment source

## 2022-04-06 DIAGNOSIS — R1031 Right lower quadrant pain: Secondary | ICD-10-CM | POA: Diagnosis present

## 2022-04-06 DIAGNOSIS — Z8546 Personal history of malignant neoplasm of prostate: Secondary | ICD-10-CM | POA: Diagnosis not present

## 2022-04-06 DIAGNOSIS — N179 Acute kidney failure, unspecified: Secondary | ICD-10-CM | POA: Diagnosis not present

## 2022-04-06 DIAGNOSIS — N183 Chronic kidney disease, stage 3 unspecified: Secondary | ICD-10-CM | POA: Insufficient documentation

## 2022-04-06 DIAGNOSIS — E871 Hypo-osmolality and hyponatremia: Secondary | ICD-10-CM | POA: Insufficient documentation

## 2022-04-06 DIAGNOSIS — G8929 Other chronic pain: Secondary | ICD-10-CM | POA: Diagnosis not present

## 2022-04-06 DIAGNOSIS — E875 Hyperkalemia: Secondary | ICD-10-CM | POA: Insufficient documentation

## 2022-04-06 DIAGNOSIS — R112 Nausea with vomiting, unspecified: Secondary | ICD-10-CM

## 2022-04-06 DIAGNOSIS — R1110 Vomiting, unspecified: Secondary | ICD-10-CM | POA: Diagnosis not present

## 2022-04-06 DIAGNOSIS — I129 Hypertensive chronic kidney disease with stage 1 through stage 4 chronic kidney disease, or unspecified chronic kidney disease: Secondary | ICD-10-CM | POA: Diagnosis not present

## 2022-04-06 DIAGNOSIS — R1032 Left lower quadrant pain: Secondary | ICD-10-CM | POA: Diagnosis not present

## 2022-04-06 DIAGNOSIS — K5900 Constipation, unspecified: Secondary | ICD-10-CM | POA: Diagnosis not present

## 2022-04-06 LAB — COMPREHENSIVE METABOLIC PANEL WITH GFR
ALT: 26 U/L (ref 0–44)
AST: 34 U/L (ref 15–41)
Albumin: 3.1 g/dL — ABNORMAL LOW (ref 3.5–5.0)
Alkaline Phosphatase: 108 U/L (ref 38–126)
Anion gap: 6 (ref 5–15)
BUN: 47 mg/dL — ABNORMAL HIGH (ref 8–23)
CO2: 23 mmol/L (ref 22–32)
Calcium: 10.1 mg/dL (ref 8.9–10.3)
Chloride: 99 mmol/L (ref 98–111)
Creatinine, Ser: 3.59 mg/dL — ABNORMAL HIGH (ref 0.61–1.24)
GFR, Estimated: 17 mL/min — ABNORMAL LOW
Glucose, Bld: 128 mg/dL — ABNORMAL HIGH (ref 70–99)
Potassium: 5.5 mmol/L — ABNORMAL HIGH (ref 3.5–5.1)
Sodium: 128 mmol/L — ABNORMAL LOW (ref 135–145)
Total Bilirubin: 0.7 mg/dL (ref 0.3–1.2)
Total Protein: 8.1 g/dL (ref 6.5–8.1)

## 2022-04-06 LAB — URINALYSIS, ROUTINE W REFLEX MICROSCOPIC
Bilirubin Urine: NEGATIVE
Glucose, UA: NEGATIVE mg/dL
Hgb urine dipstick: NEGATIVE
Ketones, ur: NEGATIVE mg/dL
Nitrite: NEGATIVE
Protein, ur: NEGATIVE mg/dL
Specific Gravity, Urine: 1.01 (ref 1.005–1.030)
pH: 5 (ref 5.0–8.0)

## 2022-04-06 LAB — LIPASE, BLOOD: Lipase: 23 U/L (ref 11–51)

## 2022-04-06 LAB — BASIC METABOLIC PANEL
Anion gap: 4 — ABNORMAL LOW (ref 5–15)
BUN: 46 mg/dL — ABNORMAL HIGH (ref 8–23)
CO2: 21 mmol/L — ABNORMAL LOW (ref 22–32)
Calcium: 8.8 mg/dL — ABNORMAL LOW (ref 8.9–10.3)
Chloride: 105 mmol/L (ref 98–111)
Creatinine, Ser: 3.22 mg/dL — ABNORMAL HIGH (ref 0.61–1.24)
GFR, Estimated: 19 mL/min — ABNORMAL LOW (ref >60)
Glucose, Bld: 127 mg/dL — ABNORMAL HIGH (ref 70–99)
Potassium: 5 mmol/L (ref 3.5–5.1)
Sodium: 130 mmol/L — ABNORMAL LOW (ref 135–145)

## 2022-04-06 LAB — CBC
HCT: 35.2 % — ABNORMAL LOW (ref 39.0–52.0)
Hemoglobin: 10.7 g/dL — ABNORMAL LOW (ref 13.0–17.0)
MCH: 25.3 pg — ABNORMAL LOW (ref 26.0–34.0)
MCHC: 30.4 g/dL (ref 30.0–36.0)
MCV: 83.2 fL (ref 80.0–100.0)
Platelets: 400 10*3/uL (ref 150–400)
RBC: 4.23 MIL/uL (ref 4.22–5.81)
RDW: 16.2 % — ABNORMAL HIGH (ref 11.5–15.5)
WBC: 11.2 10*3/uL — ABNORMAL HIGH (ref 4.0–10.5)
nRBC: 0 % (ref 0.0–0.2)

## 2022-04-06 LAB — TROPONIN I (HIGH SENSITIVITY)
Troponin I (High Sensitivity): 10 ng/L
Troponin I (High Sensitivity): 7 ng/L (ref <18)

## 2022-04-06 MED ORDER — OXYCODONE HCL 5 MG PO TABS: 5.0000 mg | ORAL_TABLET | Freq: Three times a day (TID) | ORAL | 0 refills | Status: DC | PRN

## 2022-04-06 MED ORDER — FENTANYL CITRATE PF 50 MCG/ML IJ SOSY
75.0000 ug | PREFILLED_SYRINGE | Freq: Once | INTRAMUSCULAR | Status: AC
  Administered 2022-04-06: 75 ug via INTRAVENOUS
  Filled 2022-04-06: qty 2

## 2022-04-06 MED ORDER — OXYCODONE HCL 5 MG PO TABS
5.0000 mg | ORAL_TABLET | Freq: Once | ORAL | Status: AC
  Administered 2022-04-06: 5 mg via ORAL
  Filled 2022-04-06: qty 1

## 2022-04-06 MED ORDER — LACTATED RINGERS IV BOLUS
1000.0000 mL | Freq: Once | INTRAVENOUS | Status: AC
  Administered 2022-04-06: 1000 mL via INTRAVENOUS

## 2022-04-06 MED ORDER — ACETAMINOPHEN 500 MG PO TABS
1000.0000 mg | ORAL_TABLET | Freq: Once | ORAL | Status: AC
  Administered 2022-04-06: 1000 mg via ORAL
  Filled 2022-04-06: qty 2

## 2022-04-06 NOTE — Discharge Instructions (Signed)
Follow-up with your doctor this week for recheck of your creatinine (kidney number).  It was 3.59 --> 3.22 today.  Use MiraLAX daily to help with your constipation

## 2022-04-06 NOTE — ED Provider Notes (Signed)
[facility] Provider Note    Event Date/Time   First MD Initiated Contact with Patient 04/06/22 0115     (approximate)   History   Abdominal Pain, Nausea, Emesis, and Constipation   HPI  SEMISI Ramos is a 81 y.o. male who presents to the ED for evaluation of Abdominal Pain, Nausea, Emesis, and Constipation   I review oncology note from the [place] from 6/7.  History of metastatic prostate cancer.  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  Chronic abdominal pain and multiple ED visits for this in the past few months.  Patient presents to the ED for evaluation of acute on chronic lower abdominal pain.  He reports for the past 3 days has been more severe to his bilateral lower abdomen.  This is the site of his chronic pain.  Reports he has not passed a bowel movement for 4 to 5 days in the setting of using regular hydrocodone.  Reports an episode of nonbloody nonbilious emesis today.  His daughter later arrives at the bedside and reports that he has not been drinking much at all for the past few weeks.  Patient denies any chest pain, cough or shortness of breath.  Physical Exam   Triage Vital Signs: ED Triage Vitals  Enc Vitals Group     BP 04/06/22 0018 124/78     Pulse Rate 04/06/22 0018 (!) 120     Resp 04/06/22 0018 18     Temp 04/06/22 0018 98.8 F (37.1 C)     Temp Source 04/06/22 0018 Oral     SpO2 04/06/22 0018 100 %     Weight 04/06/22 0019 199 lb 15.3 oz (90.7 kg)     Height 04/06/22 0019 '5\' 11"'$  (1.803 m)     Head Circumference --      Peak Flow --      Pain Score 04/06/22 0019 10     Pain Loc --      Pain Edu? --      Excl. in [place]? --     Most recent vital signs: Vitals:   04/06/22 0018 04/06/22 0339  BP: 124/78 111/82  Pulse: (!) 120 99  Resp: 18 18  Temp: 98.8 F (37.1 C) 98.6 F (37 C)  SpO2: 100% 100%    General: Awake, no distress.  Pleasant and conversational sentences.  Well-appearing. CV:  Good  peripheral perfusion.  Resp:  Normal effort.  Abd:  No distention.  Mild and poorly localizing lower abdominal tenderness without peritoneal features. MSK:  No deformity noted.  Neuro:  No focal deficits appreciated. Other:     ED Results / Procedures / Treatments   Labs (all labs ordered are listed, but only abnormal results are displayed) Labs Reviewed  COMPREHENSIVE METABOLIC PANEL - Abnormal; Notable for the following components:      Result Value   Sodium 128 (*)    Potassium 5.5 (*)    Glucose, Bld 128 (*)    BUN 47 (*)    Creatinine, Ser 3.59 (*)    Albumin 3.1 (*)    GFR, Estimated 17 (*)    All other components within normal limits  CBC - Abnormal; Notable for the following components:   WBC 11.2 (*)    Hemoglobin 10.7 (*)    HCT 35.2 (*)    MCH 25.3 (*)    RDW 16.2 (*)    All other components within normal limits  URINALYSIS,  ROUTINE W REFLEX MICROSCOPIC - Abnormal; Notable for the following components:   Color, Urine YELLOW (*)    APPearance HAZY (*)    Leukocytes,Ua TRACE (*)    Bacteria, UA MANY (*)    All other components within normal limits  BASIC METABOLIC PANEL - Abnormal; Notable for the following components:   Sodium 130 (*)    CO2 21 (*)    Glucose, Bld 127 (*)    BUN 46 (*)    Creatinine, Ser 3.22 (*)    Calcium 8.8 (*)    GFR, Estimated 19 (*)    Anion gap 4 (*)    All other components within normal limits  LIPASE, BLOOD  TROPONIN I (HIGH SENSITIVITY)  TROPONIN I (HIGH SENSITIVITY)    EKG Sinus tachycardia the rate of 123 bpm.  She was baseline.  Normal axis and intervals.  Nonspecific ST changes inferiorly without STEMI.  RADIOLOGY CT abdomen/pelvis interpreted by me without evidence of acute intra-abdominal pathology.  Official radiology report(s): CT ABDOMEN PELVIS WO CONTRAST  Result Date: 04/06/2022 CLINICAL DATA:  Abdominal pain and emesis EXAM: CT ABDOMEN AND PELVIS WITHOUT CONTRAST TECHNIQUE: Multidetector CT imaging of the  abdomen and pelvis was performed following the standard protocol without IV contrast. RADIATION DOSE REDUCTION: This exam was performed according to the departmental dose-optimization program which includes automated exposure control, adjustment of the mA and/or kV according to patient size and/or use of iterative reconstruction technique. COMPARISON:  03/04/2022 FINDINGS: Lower Chest: Bibasilar pulmonary nodules, the largest of which measures 11 mm (image 4). Hepatobiliary: Hypodense hepatic lesions are poorly visualized on this noncontrast study. Otherwise normal liver. No biliary dilatation. The gallbladder is normal. Pancreas: Normal pancreas. No ductal dilatation or peripancreatic fluid collection. Spleen: Cystic area in the spleen is unchanged. Adrenals/Urinary Tract: The adrenal glands are unchanged with likely adenomatous hyperplasia. Multiple low-density left renal lesions, unchanged. These are characterized on outside MRI of 12/05/2021. The urinary bladder is normal for degree of distention Stomach/Bowel: There is no hiatal hernia. Normal duodenal course and caliber. No small bowel dilatation or inflammation. No focal colonic abnormality. Normal appendix. Vascular/Lymphatic: 3.2 cm abdominal aortic aneurysm, unchanged. Reproductive: Prostate gland is difficult to visualize due to streak artifacts from hip arthroplasty. Other: None. Musculoskeletal: Left hip arthroplasty. Multilevel degenerative disc and facet disease IMPRESSION: 1. No acute abnormality of the abdomen or pelvis. 2. Multiple pulmonary nodules. Most severe: 11 mm solid pulmonary nodule detected on incomplete chest CT. Recommend prompt non-contrast Chest CT for further evaluation. These guidelines do not apply to immunocompromised patients and patients with cancer. Follow up in patients with significant comorbidities as clinically warranted. For lung cancer screening, adhere to Lung-RADS guidelines. Reference: Radiology. 2017; 284(1):228-43.  3. Hypodense lesions of the liver poorly characterized on this non-contrast study. 4. 3.2 cm abdominal aortic aneurysm. Recommend follow-up every 3 years. Reference: J Am Coll Radiol 9735;32:992-426. Electronically Signed   By: Ulyses Jarred M.D.   On: 04/06/2022 02:31    PROCEDURES and INTERVENTIONS:  .1-3 Lead EKG Interpretation  Performed by: Vladimir Crofts, MD Authorized by: Vladimir Crofts, MD     Interpretation: abnormal     ECG rate:  101   ECG rate assessment: tachycardic     Rhythm: sinus tachycardia     Ectopy: none     Conduction: normal     Medications  lactated ringers bolus 1,000 mL (1,000 mLs Intravenous New Bag/Given 04/06/22 0142)  fentaNYL (SUBLIMAZE) injection 75 mcg (75 mcg Intravenous Given 04/06/22 0143)  acetaminophen (TYLENOL) tablet 1,000 mg (1,000 mg Oral Given 04/06/22 0334)  oxyCODONE (Oxy IR/ROXICODONE) immediate release tablet 5 mg (5 mg Oral Given 04/06/22 0334)     IMPRESSION / MDM / ASSESSMENT AND PLAN / ED COURSE  I reviewed the triage vital signs and the nursing notes.  Differential diagnosis includes, but is not limited to, SBO, diverticulitis, acute cystitis, AKI, dehydration, pancreatitis  {Patient presents with symptoms of an acute illness or injury that is potentially life-threatening.  81 year old male presents to the ED with acute on chronic abdominal pain after an episode of emesis.  He has evidence of a mild AKI on blood work, improved with IV fluids and ultimately suitable for trial of outpatient management.  Looks systemically well.  Has some mild improved localizing lower abdominal tenderness without peritoneal features.  Initial blood work with AKI, mild hyperkalemia and hyponatremia.  After 1 L of LR, these metabolic derangements improve on recheck.  Otherwise marginal leukocytosis is noted.  Urine without infectious features considering his lack of urinary symptoms.  Negative lipase.  Consider observation admission to the patient and offered this to  him, but he declines indicating his preference to go home.  We discussed very close follow-up and return precautions.  Clinical Course as of 04/06/22 0452  Sun Apr 06, 2022  0333 Reassessed. Daughter at the bedside. Patient reports feeling better. We discuss plan of care, rising CR, but reassuring CT abd [DS]  6295 Reassessed.  Patient reports feeling better.  We discussed his improving creatinine, although not back to baseline.  We discussed disposition for a while.  Shared decision-making yields plan to pursue outpatient management.  He was to go home.  He states that his wife and daughter.  Daughter at the bedside is comfortable with this.  We discussed close follow-up with his PCP this week to have his creatinine rechecked.  We discussed management of constipation at home.  We discussed management of poor p.o. intake at home.  We discussed return precautions for the ED. [DS]    Clinical Course User Index [DS] Vladimir Crofts, MD     FINAL CLINICAL IMPRESSION(S) / ED DIAGNOSES   Final diagnoses:  Chronic abdominal pain  Nausea and vomiting, unspecified vomiting type  AKI (acute kidney injury) ([place])     Rx / DC Orders   ED Discharge Orders          Ordered    oxyCODONE (ROXICODONE) 5 MG immediate release tablet  Every 8 hours PRN        04/06/22 0448             Note:  This document was prepared using Dragon voice recognition software and may include unintentional dictation errors.   Vladimir Crofts, MD 04/06/22 [phone]

## 2022-04-21 ENCOUNTER — Emergency Department: Payer: No Typology Code available for payment source

## 2022-04-21 ENCOUNTER — Other Ambulatory Visit: Payer: Self-pay

## 2022-04-21 ENCOUNTER — Inpatient Hospital Stay
Admission: EM | Admit: 2022-04-21 | Discharge: 2022-04-24 | DRG: 690 | Disposition: A | Discharge status: Home / Self Care | Payer: No Typology Code available for payment source | Attending: Internal Medicine | Admitting: Internal Medicine

## 2022-04-21 DIAGNOSIS — Z85038 Personal history of other malignant neoplasm of large intestine: Secondary | ICD-10-CM

## 2022-04-21 DIAGNOSIS — Z91041 Radiographic dye allergy status: Secondary | ICD-10-CM

## 2022-04-21 DIAGNOSIS — Z8546 Personal history of malignant neoplasm of prostate: Secondary | ICD-10-CM

## 2022-04-21 DIAGNOSIS — N184 Chronic kidney disease, stage 4 (severe): Secondary | ICD-10-CM | POA: Diagnosis present

## 2022-04-21 DIAGNOSIS — Z515 Encounter for palliative care: Secondary | ICD-10-CM

## 2022-04-21 DIAGNOSIS — Z9049 Acquired absence of other specified parts of digestive tract: Secondary | ICD-10-CM

## 2022-04-21 DIAGNOSIS — N390 Urinary tract infection, site not specified: Secondary | ICD-10-CM | POA: Diagnosis present

## 2022-04-21 DIAGNOSIS — Z1629 Resistance to other single specified antibiotic: Secondary | ICD-10-CM | POA: Diagnosis present

## 2022-04-21 DIAGNOSIS — G893 Neoplasm related pain (acute) (chronic): Secondary | ICD-10-CM | POA: Diagnosis present

## 2022-04-21 DIAGNOSIS — N3001 Acute cystitis with hematuria: Principal | ICD-10-CM | POA: Diagnosis present

## 2022-04-21 DIAGNOSIS — B9620 Unspecified Escherichia coli [E. coli] as the cause of diseases classified elsewhere: Secondary | ICD-10-CM | POA: Diagnosis present

## 2022-04-21 DIAGNOSIS — I10 Essential (primary) hypertension: Secondary | ICD-10-CM | POA: Diagnosis present

## 2022-04-21 DIAGNOSIS — Z905 Acquired absence of kidney: Secondary | ICD-10-CM

## 2022-04-21 DIAGNOSIS — A419 Sepsis, unspecified organism: Principal | ICD-10-CM

## 2022-04-21 DIAGNOSIS — R109 Unspecified abdominal pain: Secondary | ICD-10-CM | POA: Diagnosis present

## 2022-04-21 DIAGNOSIS — C7800 Secondary malignant neoplasm of unspecified lung: Secondary | ICD-10-CM | POA: Diagnosis present

## 2022-04-21 DIAGNOSIS — E871 Hypo-osmolality and hyponatremia: Secondary | ICD-10-CM

## 2022-04-21 DIAGNOSIS — I129 Hypertensive chronic kidney disease with stage 1 through stage 4 chronic kidney disease, or unspecified chronic kidney disease: Secondary | ICD-10-CM | POA: Diagnosis present

## 2022-04-21 DIAGNOSIS — N3000 Acute cystitis without hematuria: Secondary | ICD-10-CM

## 2022-04-21 DIAGNOSIS — Z96642 Presence of left artificial hip joint: Secondary | ICD-10-CM | POA: Diagnosis present

## 2022-04-21 DIAGNOSIS — E785 Hyperlipidemia, unspecified: Secondary | ICD-10-CM | POA: Diagnosis present

## 2022-04-21 DIAGNOSIS — Z85850 Personal history of malignant neoplasm of thyroid: Secondary | ICD-10-CM

## 2022-04-21 DIAGNOSIS — E860 Dehydration: Secondary | ICD-10-CM | POA: Diagnosis present

## 2022-04-21 DIAGNOSIS — N1832 Chronic kidney disease, stage 3b: Secondary | ICD-10-CM | POA: Diagnosis present

## 2022-04-21 DIAGNOSIS — E875 Hyperkalemia: Secondary | ICD-10-CM | POA: Diagnosis present

## 2022-04-21 DIAGNOSIS — E222 Syndrome of inappropriate secretion of antidiuretic hormone: Secondary | ICD-10-CM | POA: Diagnosis present

## 2022-04-21 DIAGNOSIS — Z85528 Personal history of other malignant neoplasm of kidney: Secondary | ICD-10-CM

## 2022-04-21 DIAGNOSIS — I441 Atrioventricular block, second degree: Secondary | ICD-10-CM | POA: Diagnosis present

## 2022-04-21 DIAGNOSIS — I714 Abdominal aortic aneurysm, without rupture, unspecified: Secondary | ICD-10-CM | POA: Diagnosis present

## 2022-04-21 DIAGNOSIS — Z79899 Other long term (current) drug therapy: Secondary | ICD-10-CM

## 2022-04-21 DIAGNOSIS — J189 Pneumonia, unspecified organism: Secondary | ICD-10-CM | POA: Diagnosis present

## 2022-04-21 HISTORY — DX: Malignant (primary) neoplasm, unspecified: C80.1

## 2022-04-21 HISTORY — DX: Type 2 diabetes mellitus without complications: E11.9

## 2022-04-21 HISTORY — DX: Unspecified osteoarthritis, unspecified site: M19.90

## 2022-04-21 LAB — URINALYSIS, ROUTINE W REFLEX MICROSCOPIC
Bilirubin Urine: NEGATIVE
Glucose, UA: NEGATIVE mg/dL
Ketones, ur: NEGATIVE mg/dL
Nitrite: NEGATIVE
Protein, ur: 30 mg/dL — AB
Specific Gravity, Urine: 1.008 (ref 1.005–1.030)
WBC, UA: >50 WBC/hpf — ABNORMAL HIGH (ref 0–5)
pH: 5 (ref 5.0–8.0)

## 2022-04-21 LAB — COMPREHENSIVE METABOLIC PANEL
ALT: 25 U/L (ref 0–44)
AST: 20 U/L (ref 15–41)
Albumin: 2.6 g/dL — ABNORMAL LOW (ref 3.5–5.0)
Alkaline Phosphatase: 122 U/L (ref 38–126)
Anion gap: 11 (ref 5–15)
BUN: 43 mg/dL — ABNORMAL HIGH (ref 8–23)
CO2: 21 mmol/L — ABNORMAL LOW (ref 22–32)
Calcium: 9.9 mg/dL (ref 8.9–10.3)
Chloride: 96 mmol/L — ABNORMAL LOW (ref 98–111)
Creatinine, Ser: 3.08 mg/dL — ABNORMAL HIGH (ref 0.61–1.24)
GFR, Estimated: 20 mL/min — ABNORMAL LOW (ref >60)
Glucose, Bld: 145 mg/dL — ABNORMAL HIGH (ref 70–99)
Potassium: 5.4 mmol/L — ABNORMAL HIGH (ref 3.5–5.1)
Sodium: 128 mmol/L — ABNORMAL LOW (ref 135–145)
Total Bilirubin: 1 mg/dL (ref 0.3–1.2)
Total Protein: 7.1 g/dL (ref 6.5–8.1)

## 2022-04-21 LAB — CBC
HCT: 30.8 % — ABNORMAL LOW (ref 39.0–52.0)
Hemoglobin: 9.6 g/dL — ABNORMAL LOW (ref 13.0–17.0)
MCH: 24.9 pg — ABNORMAL LOW (ref 26.0–34.0)
MCHC: 31.2 g/dL (ref 30.0–36.0)
MCV: 79.8 fL — ABNORMAL LOW (ref 80.0–100.0)
Platelets: 417 10*3/uL — ABNORMAL HIGH (ref 150–400)
RBC: 3.86 MIL/uL — ABNORMAL LOW (ref 4.22–5.81)
RDW: 16 % — ABNORMAL HIGH (ref 11.5–15.5)
WBC: 17.6 10*3/uL — ABNORMAL HIGH (ref 4.0–10.5)
nRBC: 0 % (ref 0.0–0.2)

## 2022-04-21 LAB — LACTIC ACID, PLASMA: Lactic Acid, Venous: 1.2 mmol/L (ref 0.5–1.9)

## 2022-04-21 LAB — LIPASE, BLOOD: Lipase: 22 U/L (ref 11–51)

## 2022-04-21 MED ORDER — MORPHINE SULFATE (PF) 4 MG/ML IV SOLN
4.0000 mg | Freq: Once | INTRAVENOUS | Status: AC
  Administered 2022-04-21: 4 mg via INTRAVENOUS

## 2022-04-21 MED ORDER — SODIUM CHLORIDE 0.9 % IV SOLN
1.0000 g | Freq: Once | INTRAVENOUS | Status: AC
  Administered 2022-04-22: 1 g via INTRAVENOUS

## 2022-04-21 MED ORDER — DICYCLOMINE HCL 10 MG PO CAPS: 20.0000 mg | ORAL_CAPSULE | Freq: Once | ORAL | Status: AC

## 2022-04-21 MED ORDER — DICYCLOMINE HCL 10 MG PO CAPS
ORAL_CAPSULE | ORAL | Status: AC
  Administered 2022-04-22: 20 mg via ORAL
  Filled 2022-04-21: qty 2

## 2022-04-21 MED ORDER — MORPHINE SULFATE (PF) 4 MG/ML IV SOLN
INTRAVENOUS | Status: AC
  Filled 2022-04-21: qty 1

## 2022-04-21 MED ORDER — ONDANSETRON HCL 4 MG/2ML IJ SOLN
4.0000 mg | Freq: Once | INTRAMUSCULAR | Status: AC
  Administered 2022-04-21: 4 mg via INTRAVENOUS

## 2022-04-21 MED ORDER — LACTATED RINGERS IV BOLUS
1000.0000 mL | Freq: Once | INTRAVENOUS | Status: AC
  Administered 2022-04-22: 1000 mL via INTRAVENOUS

## 2022-04-21 MED ORDER — ONDANSETRON HCL 4 MG/2ML IJ SOLN
INTRAMUSCULAR | Status: AC
  Filled 2022-04-21: qty 2

## 2022-04-21 NOTE — ED Provider Triage Note (Signed)
Emergency Medicine Provider Triage Evaluation Note  Gregory Ramos , a 81 y.o. male  was evaluated in triage.  Pt complains of abdominal pain with nausea, vomiting.  Decreased po intake so decreased BM and urine.  No know fever or chills.  Had intestinal surgery for "[facility] less than a year ago.  Family with patient giving much of history.   Review of Systems  Positive: N/V Negative:   Physical Exam  BP 131/82 (BP Location: Left Arm)   Pulse 98   Temp 98.1 F (36.7 C) (Oral)   Resp 18   SpO2 100%  Gen:   Awake, no distress   Resp:  Normal effort  MSK:   Moves extremities without difficulty  Other:    Medical Decision Making  Medically screening exam initiated at 12:51 PM.  Appropriate orders placed.  Gregory Ramos was informed that the remainder of the evaluation will be completed by another provider, this initial triage assessment does not replace that evaluation, and the importance of remaining in the ED until their evaluation is complete.     Johnn Hai, PA-C 04/21/22 1302

## 2022-04-21 NOTE — ED Triage Notes (Signed)
Pt in with co abd pain with n.v.d. family states for months. [facility].

## 2022-04-21 NOTE — ED Provider Notes (Signed)
[facility] Provider Note    Event Date/Time   First MD Initiated Contact with Patient 04/21/22 2257     (approximate)   History   Abdominal Pain   HPI  Gregory Ramos is a 81 y.o. male who presents to the ED for evaluation of Abdominal Pain   Upper endoscopy on Friday.  Still pain after endoscopy , went to an urgent care, started cipro but first dose just today.    Physical Exam   Triage Vital Signs: ED Triage Vitals  Enc Vitals Group     BP 04/21/22 1249 131/82     Pulse Rate 04/21/22 1249 98     Resp 04/21/22 1249 18     Temp 04/21/22 1249 98.1 F (36.7 C)     Temp Source 04/21/22 1249 Oral     SpO2 04/21/22 1249 100 %     Weight 04/21/22 1251 199 lb (90.3 kg)     Height 04/21/22 1251 '5\' 11"'$  (1.803 m)     Head Circumference --      Peak Flow --      Pain Score 04/21/22 1250 10     Pain Loc --      Pain Edu? --      Excl. in [place]? --     Most recent vital signs: Vitals:   04/21/22 1249 04/21/22 1924  BP: 131/82 108/79  Pulse: 98 82  Resp: 18 16  Temp: 98.1 F (36.7 C) 97.9 F (36.6 C)  SpO2: 100% 100%    General: Awake, no distress. *** CV:  Good peripheral perfusion.  Resp:  Normal effort.  Abd:  No distention.  MSK:  No deformity noted.  Neuro:  No focal deficits appreciated. Other:     ED Results / Procedures / Treatments   Labs (all labs ordered are listed, but only abnormal results are displayed) Labs Reviewed  CBC - Abnormal; Notable for the following components:      Result Value   WBC 17.6 (*)    RBC 3.86 (*)    Hemoglobin 9.6 (*)    HCT 30.8 (*)    MCV 79.8 (*)    MCH 24.9 (*)    RDW 16.0 (*)    Platelets 417 (*)    All other components within normal limits  COMPREHENSIVE METABOLIC PANEL - Abnormal; Notable for the following components:   Sodium 128 (*)    Potassium 5.4 (*)    Chloride 96 (*)    CO2 21 (*)    Glucose, Bld 145 (*)    BUN 43 (*)    Creatinine, Ser 3.08 (*)    Albumin 2.6 (*)     GFR, Estimated 20 (*)    All other components within normal limits  URINALYSIS, ROUTINE W REFLEX MICROSCOPIC - Abnormal; Notable for the following components:   Color, Urine YELLOW (*)    APPearance TURBID (*)    Hgb urine dipstick MODERATE (*)    Protein, ur 30 (*)    Leukocytes,Ua LARGE (*)    WBC, UA >50 (*)    Bacteria, UA MANY (*)    All other components within normal limits  URINE CULTURE  CULTURE, BLOOD (ROUTINE X 2)  CULTURE, BLOOD (ROUTINE X 2)  LIPASE, BLOOD  LACTIC ACID, PLASMA  LACTIC ACID, PLASMA  CBC WITH DIFFERENTIAL/PLATELET  PROCALCITONIN  PROCALCITONIN    EKG ***  RADIOLOGY ***  Official radiology report(s): No results found.  PROCEDURES and INTERVENTIONS:  Procedures  Medications  lactated ringers bolus 1,000 mL (has no administration in time range)  cefTRIAXone (ROCEPHIN) 1 g in sodium chloride 0.9 % 100 mL IVPB (has no administration in time range)  morphine (PF) 4 MG/ML injection 4 mg (4 mg Intravenous Given 04/21/22 2247)  ondansetron (ZOFRAN) injection 4 mg (4 mg Intravenous Given 04/21/22 2246)     IMPRESSION / MDM / ASSESSMENT AND PLAN / ED COURSE  I reviewed the triage vital signs and the nursing notes.  Differential diagnosis includes, but is not limited to, ***  {Patient presents with symptoms of an acute illness or injury that is potentially life-threatening.}      FINAL CLINICAL IMPRESSION(S) / ED DIAGNOSES   Final diagnoses:  None     Rx / DC Orders   ED Discharge Orders     None        Note:  This document was prepared using Dragon voice recognition software and may include unintentional dictation errors.

## 2022-04-22 ENCOUNTER — Inpatient Hospital Stay: Payer: No Typology Code available for payment source

## 2022-04-22 ENCOUNTER — Encounter: Payer: Self-pay | Admitting: Internal Medicine

## 2022-04-22 DIAGNOSIS — R1084 Generalized abdominal pain: Secondary | ICD-10-CM

## 2022-04-22 DIAGNOSIS — R109 Unspecified abdominal pain: Secondary | ICD-10-CM | POA: Diagnosis present

## 2022-04-22 DIAGNOSIS — E875 Hyperkalemia: Secondary | ICD-10-CM | POA: Diagnosis present

## 2022-04-22 DIAGNOSIS — E222 Syndrome of inappropriate secretion of antidiuretic hormone: Secondary | ICD-10-CM | POA: Diagnosis present

## 2022-04-22 DIAGNOSIS — E871 Hypo-osmolality and hyponatremia: Secondary | ICD-10-CM | POA: Diagnosis present

## 2022-04-22 DIAGNOSIS — Z1629 Resistance to other single specified antibiotic: Secondary | ICD-10-CM | POA: Diagnosis present

## 2022-04-22 DIAGNOSIS — I441 Atrioventricular block, second degree: Secondary | ICD-10-CM | POA: Diagnosis present

## 2022-04-22 DIAGNOSIS — Z85850 Personal history of malignant neoplasm of thyroid: Secondary | ICD-10-CM | POA: Diagnosis not present

## 2022-04-22 DIAGNOSIS — I714 Abdominal aortic aneurysm, without rupture, unspecified: Secondary | ICD-10-CM | POA: Diagnosis present

## 2022-04-22 DIAGNOSIS — A419 Sepsis, unspecified organism: Secondary | ICD-10-CM | POA: Diagnosis not present

## 2022-04-22 DIAGNOSIS — Z8546 Personal history of malignant neoplasm of prostate: Secondary | ICD-10-CM | POA: Diagnosis not present

## 2022-04-22 DIAGNOSIS — Z515 Encounter for palliative care: Secondary | ICD-10-CM | POA: Diagnosis not present

## 2022-04-22 DIAGNOSIS — I129 Hypertensive chronic kidney disease with stage 1 through stage 4 chronic kidney disease, or unspecified chronic kidney disease: Secondary | ICD-10-CM | POA: Diagnosis present

## 2022-04-22 DIAGNOSIS — Z91041 Radiographic dye allergy status: Secondary | ICD-10-CM | POA: Diagnosis not present

## 2022-04-22 DIAGNOSIS — E785 Hyperlipidemia, unspecified: Secondary | ICD-10-CM | POA: Diagnosis present

## 2022-04-22 DIAGNOSIS — Z85528 Personal history of other malignant neoplasm of kidney: Secondary | ICD-10-CM | POA: Diagnosis not present

## 2022-04-22 DIAGNOSIS — Z85038 Personal history of other malignant neoplasm of large intestine: Secondary | ICD-10-CM | POA: Diagnosis not present

## 2022-04-22 DIAGNOSIS — Z905 Acquired absence of kidney: Secondary | ICD-10-CM | POA: Diagnosis not present

## 2022-04-22 DIAGNOSIS — N3001 Acute cystitis with hematuria: Secondary | ICD-10-CM | POA: Diagnosis present

## 2022-04-22 DIAGNOSIS — E860 Dehydration: Secondary | ICD-10-CM | POA: Diagnosis present

## 2022-04-22 DIAGNOSIS — Z79899 Other long term (current) drug therapy: Secondary | ICD-10-CM | POA: Diagnosis not present

## 2022-04-22 DIAGNOSIS — Z96642 Presence of left artificial hip joint: Secondary | ICD-10-CM | POA: Diagnosis present

## 2022-04-22 DIAGNOSIS — Z9049 Acquired absence of other specified parts of digestive tract: Secondary | ICD-10-CM | POA: Diagnosis not present

## 2022-04-22 DIAGNOSIS — G893 Neoplasm related pain (acute) (chronic): Secondary | ICD-10-CM | POA: Diagnosis present

## 2022-04-22 DIAGNOSIS — C7800 Secondary malignant neoplasm of unspecified lung: Secondary | ICD-10-CM | POA: Diagnosis present

## 2022-04-22 DIAGNOSIS — N184 Chronic kidney disease, stage 4 (severe): Secondary | ICD-10-CM | POA: Diagnosis present

## 2022-04-22 DIAGNOSIS — N390 Urinary tract infection, site not specified: Secondary | ICD-10-CM | POA: Diagnosis not present

## 2022-04-22 DIAGNOSIS — B9620 Unspecified Escherichia coli [E. coli] as the cause of diseases classified elsewhere: Secondary | ICD-10-CM | POA: Diagnosis present

## 2022-04-22 LAB — CBC WITH DIFFERENTIAL/PLATELET
Abs Immature Granulocytes: 0.23 10*3/uL — ABNORMAL HIGH (ref 0.00–0.07)
Basophils Absolute: 0.1 10*3/uL (ref 0.0–0.1)
Basophils Relative: 1 %
Eosinophils Absolute: 0.6 10*3/uL — ABNORMAL HIGH (ref 0.0–0.5)
Eosinophils Relative: 4 %
HCT: 28 % — ABNORMAL LOW (ref 39.0–52.0)
Hemoglobin: 8.6 g/dL — ABNORMAL LOW (ref 13.0–17.0)
Immature Granulocytes: 2 %
Lymphocytes Relative: 8 %
Lymphs Abs: 1.2 10*3/uL (ref 0.7–4.0)
MCH: 25.2 pg — ABNORMAL LOW (ref 26.0–34.0)
MCHC: 30.7 g/dL (ref 30.0–36.0)
MCV: 82.1 fL (ref 80.0–100.0)
Monocytes Absolute: 1.3 10*3/uL — ABNORMAL HIGH (ref 0.1–1.0)
Monocytes Relative: 8 %
Neutro Abs: 12.1 10*3/uL — ABNORMAL HIGH (ref 1.7–7.7)
Neutrophils Relative %: 77 %
Platelets: 352 10*3/uL (ref 150–400)
RBC: 3.41 MIL/uL — ABNORMAL LOW (ref 4.22–5.81)
RDW: 16.3 % — ABNORMAL HIGH (ref 11.5–15.5)
WBC: 15.4 10*3/uL — ABNORMAL HIGH (ref 4.0–10.5)
nRBC: 0 % (ref 0.0–0.2)

## 2022-04-22 LAB — PROTIME-INR
INR: 1.4 — ABNORMAL HIGH (ref 0.8–1.2)
Prothrombin Time: 16.6 seconds — ABNORMAL HIGH (ref 11.4–15.2)

## 2022-04-22 LAB — BASIC METABOLIC PANEL
Anion gap: 9 (ref 5–15)
BUN: 39 mg/dL — ABNORMAL HIGH (ref 8–23)
CO2: 21 mmol/L — ABNORMAL LOW (ref 22–32)
Calcium: 9.8 mg/dL (ref 8.9–10.3)
Chloride: 99 mmol/L (ref 98–111)
Creatinine, Ser: 2.89 mg/dL — ABNORMAL HIGH (ref 0.61–1.24)
GFR, Estimated: 21 mL/min — ABNORMAL LOW (ref >60)
Glucose, Bld: 116 mg/dL — ABNORMAL HIGH (ref 70–99)
Potassium: 5.7 mmol/L — ABNORMAL HIGH (ref 3.5–5.1)
Sodium: 129 mmol/L — ABNORMAL LOW (ref 135–145)

## 2022-04-22 LAB — LACTIC ACID, PLASMA: Lactic Acid, Venous: 0.8 mmol/L (ref 0.5–1.9)

## 2022-04-22 LAB — CBG MONITORING, ED
Glucose-Capillary: 101 mg/dL — ABNORMAL HIGH (ref 70–99)
Glucose-Capillary: 119 mg/dL — ABNORMAL HIGH (ref 70–99)
Glucose-Capillary: 106 mg/dL — ABNORMAL HIGH (ref 70–99)

## 2022-04-22 LAB — HEMOGLOBIN A1C
Hgb A1c MFr Bld: 6.5 % — ABNORMAL HIGH (ref 4.8–5.6)
Mean Plasma Glucose: 139.85 mg/dL

## 2022-04-22 LAB — PROCALCITONIN
Procalcitonin: 1.86 ng/mL
Procalcitonin: 1.91 ng/mL

## 2022-04-22 MED ORDER — LACTATED RINGERS IV SOLN: INTRAVENOUS | Status: DC

## 2022-04-22 MED ORDER — SODIUM CHLORIDE 0.9 % IV SOLN
500.0000 mg | Freq: Once | INTRAVENOUS | Status: AC
  Administered 2022-04-22: 500 mg via INTRAVENOUS
  Filled 2022-04-22: qty 5

## 2022-04-22 MED ORDER — OXYCODONE HCL 5 MG PO TABS: 5.0000 mg | ORAL_TABLET | Freq: Three times a day (TID) | ORAL | Status: DC | PRN

## 2022-04-22 MED ORDER — AZITHROMYCIN 500 MG IV SOLR
500.0000 mg | INTRAVENOUS | Status: DC
  Administered 2022-04-22 – 2022-04-23 (×2): 500 mg via INTRAVENOUS
  Filled 2022-04-22 (×2): qty 5

## 2022-04-22 MED ORDER — SODIUM CHLORIDE 0.9% FLUSH
3.0000 mL | Freq: Two times a day (BID) | INTRAVENOUS | Status: DC
  Administered 2022-04-22 – 2022-04-24 (×6): 3 mL via INTRAVENOUS

## 2022-04-22 MED ORDER — SODIUM CHLORIDE 0.9 % IV SOLN
1.0000 g | INTRAVENOUS | Status: DC
  Administered 2022-04-22 – 2022-04-23 (×2): 1 g via INTRAVENOUS
  Filled 2022-04-22: qty 10
  Filled 2022-04-22: qty 1

## 2022-04-22 MED ORDER — ATORVASTATIN CALCIUM 20 MG PO TABS
20.0000 mg | ORAL_TABLET | Freq: Every day | ORAL | Status: DC
  Administered 2022-04-22 – 2022-04-24 (×3): 20 mg via ORAL
  Filled 2022-04-22 (×3): qty 1

## 2022-04-22 MED ORDER — INSULIN ASPART 100 UNIT/ML IJ SOLN
0.0000 [IU] | Freq: Three times a day (TID) | INTRAMUSCULAR | Status: DC
  Administered 2022-04-23 – 2022-04-24 (×2): 1 [IU] via SUBCUTANEOUS
  Filled 2022-04-22 (×2): qty 1

## 2022-04-22 MED ORDER — LACTATED RINGERS IV SOLN: INTRAVENOUS | Status: AC

## 2022-04-22 MED ORDER — OXYCODONE-ACETAMINOPHEN 5-325 MG PO TABS
1.0000 | ORAL_TABLET | Freq: Three times a day (TID) | ORAL | Status: DC | PRN
  Administered 2022-04-23 – 2022-04-24 (×3): 1 via ORAL
  Filled 2022-04-22 (×3): qty 1

## 2022-04-22 MED ORDER — SODIUM BICARBONATE 650 MG PO TABS
650.0000 mg | ORAL_TABLET | Freq: Three times a day (TID) | ORAL | Status: DC
Start: 2022-04-22 — End: 2022-04-24
  Administered 2022-04-22 – 2022-04-24 (×8): 650 mg via ORAL
  Filled 2022-04-22 (×8): qty 1

## 2022-04-22 MED ORDER — POLYETHYLENE GLYCOL 3350 17 G PO PACK
17.0000 g | PACK | Freq: Two times a day (BID) | ORAL | Status: DC
  Administered 2022-04-22 – 2022-04-23 (×3): 17 g via ORAL
  Filled 2022-04-22 (×4): qty 1

## 2022-04-22 MED ORDER — SODIUM BICARBONATE 650 MG PO TABS: 650.0000 mg | ORAL_TABLET | Freq: Three times a day (TID) | ORAL | Status: DC

## 2022-04-22 NOTE — Assessment & Plan Note (Addendum)
Sepsis ruled out as patient only has leukocytosis and does not meet the criteria for sepsis. UA concerning for UTI with positive symptoms of lower abdominal pain and dysuria. Urine cultures pending. -Continue ceftriaxone

## 2022-04-22 NOTE — Assessment & Plan Note (Signed)
D/D include Cancer related pain/ UTI. We will continue with prn pain meds.  D/W daughter that we are limited for ct as it is noncontrast.

## 2022-04-22 NOTE — Assessment & Plan Note (Addendum)
    Latest Ref Rng & Units 04/21/2022   12:49 PM 04/06/2022    3:30 AM 04/06/2022   12:38 AM  BMP  Glucose 70 - 99 mg/dL 145  127  128   BUN 8 - 23 mg/dL 43  46  47   Creatinine 0.61 - 1.24 mg/dL 3.08  3.22  3.59   Sodium 135 - 145 mmol/L 128  130  128   Potassium 3.5 - 5.1 mmol/L 5.4  5.0  5.5   Chloride 98 - 111 mmol/L 96  105  99   CO2 22 - 32 mmol/L '21  21  23   '$ Calcium 8.9 - 10.3 mg/dL 9.9  8.8  10.1   Mild hyperkalemia due to CKD. Patient was started  on po sodium bicarb tabs.  Continue holding lisinopril. Monitor potassium

## 2022-04-22 NOTE — Assessment & Plan Note (Addendum)
Attribute to dehydration and decreased PO intake and HCTZ.  Which was held on admission -Continue to monitor

## 2022-04-22 NOTE — Assessment & Plan Note (Addendum)
Blood pressure within goal.  Home antihypertensives were initially held due to concern of softer blood pressure. -Continue holding home antihypertensives-we will resume as needed

## 2022-04-22 NOTE — Sepsis Progress Note (Signed)
Monitoring for the code sepsis protocol. °

## 2022-04-22 NOTE — Progress Notes (Signed)
Progress Note   [name] RAQ:[id number] DOB: Jun 17, 1941 DOA: 04/21/2022     0 DOS: the patient was seen and examined on 04/22/2022   Brief hospital course: Taken from H&P.  Gregory Ramos is a 81 y.o. 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  Pain is mostly in lower abdomen and and also associated dysuria.  It is intermittent and crampy in nature.  Also complained of poor appetite and weight loss due to poor p.o. intake. Denies any fever, chills or upper respiratory symptoms.  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  Patient did develop pneumonia and complicated by acute hypoxic respiratory failure and was treated with IV antibiotics and responded well with management and was discharged stable not on oxygen with plan for follow-up outpatient with oncology and primary care physician.  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   >># Metastatic Castrate sensitive Prostate adenoca - High risk with PSA 50 at diagnosis and GG5 . On ADT +Darolutamide . 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   >>#Left thyroid with PET avidity:  Discussed with patient that due to radiographic testing, this is concerning  for possible malignancy. U/s guided biopsy was ab[facility] at [facility] and tissue was not diagnostic - Repeat Thyroid US - TSH and Free T4 WNL "  ED course.  Patient was hemodynamically stable, labs pertinent for leukocytosis at 17.6, mild hematuria, pyuria and bacteriuria.  Preliminary blood cultures negative.  Procalcitonin at 1.91, sodium of 128, potassium of 5.4 CT renal stone study with, creatinine of 3.08 which seems close to his baseline based on CKD stage IV. 1. Significant enlargement of multiple bilateral pulmonary nodules with infiltrates in the lung bases. Significant interval enlargement over a short time frame suggest either infectious/inflammatory process or aggressive metastatic disease. 2. Possible hepatic metastasis. 3. Distended gallbladder with thick wall, nonspecific. No stones identified. 4. Surgical absence of the right kidney.  Probable left renal cysts. 5. Low-attenuation lesions in the spleen are unchanged since prior study. 6. 3.2 cm abdominal aortic aneurysm. Recommend follow-up every 3 years.  Patient initially treated as sepsis and received broad-spectrum antibiotics, continued on ceftriaxone for concern of UTI.  7/25: Sepsis ruled out as patient never met sepsis criteria, only has  leukocytosis.  Preliminary blood cultures negative.  Slight improvement in leukocytosis but all cell lines decreased as patient received IV fluid.  No upper respiratory symptoms, so less likely  pneumonia but there was worsening pulmonary nodules with infiltrates in the lung bases.  CT chest was ordered for better characterization, concern of disease progression. Urine cultures are negative.      Assessment and Plan: * Abdominal pain D/D include Cancer related pain/ UTI. We will continue with prn pain meds.  D/W daughter that we are limited for ct as it is noncontrast.    Sepsis secondary to UTI ([place]) Sepsis ruled out as patient only has leukocytosis and does not meet the criteria for sepsis. UA concerning for UTI with positive symptoms of lower abdominal pain and dysuria. Urine cultures pending. -Continue ceftriaxone  Pneumonia Worsening lower lung field nodule and infiltrate.  But patient does not have any respiratory symptoms.  History of recent pneumonia which has been treated.  Procalcitonin elevated with a small downward trend. -CT chest for better characterization -Continue ceftriaxone and Zithromax for now  Hyperkalemia    Latest Ref Rng & Units 04/21/2022   12:49 PM 04/06/2022    3:30 AM 04/06/2022   12:38 AM  BMP  Glucose 70 - 99 mg/dL 145  127  128   BUN 8 - 23 mg/dL 43  46  47   Creatinine 0.61 - 1.24 mg/dL 3.08  3.22  3.59   Sodium 135 - 145 mmol/L 128  130  128   Potassium 3.5 - 5.1 mmol/L 5.4  5.0  5.5   Chloride 98 - 111 mmol/L 96  105  99   CO2 22 - 32 mmol/L 21  21  23    Calcium 8.9 - 10.3 mg/dL 9.9  8.8  10.1   Mild hyperkalemia due to CKD. Patient was started  on po sodium bicarb tabs.  Continue holding lisinopril. Monitor potassium    Hyponatremia Attribute to dehydration and decreased PO intake and HCTZ.  Which was held on admission -Continue to monitor  Essential hypertension Blood pressure within goal.  Home antihypertensives were initially held due to concern of softer blood pressure. -Continue holding home antihypertensives-we will resume as needed  Stage IV chronic kidney disease ([place]) Lab Results  Component Value Date    CREATININE 3.08 (H) 04/21/2022   CREATININE 3.22 (H) 04/06/2022   CREATININE 3.59 (H) 04/06/2022  mild hyperkalemia, and ACE inhibitor were held. Avoid contrast studies.  Renally dose all needed abx.     Subjective: Patient was seen and examined today.  Continued to have lower intermittent crampy abdominal pain and dysuria.  Denies any respiratory symptoms.  No fever or chills.  Physical Exam: Vitals:   04/22/22 0645 04/22/22 1245 04/22/22 1249 04/22/22 1314  BP: 117/77 121/65    Pulse: 83 81    Resp: 18 16    Temp: 98.4 F (36.9 C)   97.6 F (36.4 C)  TempSrc: Oral   Oral  SpO2: 93% 95% 95%   Weight:      Height:       General.  Well-developed gentleman, in no acute distress. Pulmonary.  Lungs clear bilaterally, normal respiratory effort. CV.  Regular rate and rhythm, no JVD, rub or murmur. Abdomen.  Soft, nontender, nondistended, BS positive. CNS.  Alert and oriented .  No focal neurologic deficit. Extremities.  No edema, no cyanosis, pulses intact and symmetrical. Psychiatry.  Judgment and insight appears normal.  Data Reviewed: Prior notes and images reviewed.  Family Communication: Called daughter with no response  Disposition: Status is: Inpatient Remains inpatient appropriate because: Severity of illness   Planned Discharge Destination: Home  Time spent: 50 minutes  This record has been created using Systems analyst. Errors have been sought and corrected,but may not always be located. Such creation errors do not reflect on the standard of care.  Author: Lorella Nimrod, MD 04/22/2022 2:56 PM  For on call review [email or web address].

## 2022-04-22 NOTE — H&P (Signed)
History and Physical    Gregory Ramos TWS:[id number] DOB: 07/31/1941 DOA: 04/21/2022  PCP: Annetta Maw, MD    Patient coming from:  Home    Chief Complaint:  Abdominal  pain.   HPI:  Gregory Ramos is a 81 y.o. male seen in ed with complaints of abdominal pain. Daughter took him to [place] for EGD this Friday 04/18/2022.  Abdominal pain: Duration: few months.   Frequency: Intermittent but progressively worse.   Location: Generalized   Quality:cramping pain.   Rate: 5/10-10/10  Radiation: N/A  Aggravating: Unknown.   Alleviating: Pain meds in Ed.  Associated factors: Pt sedated.   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  Patient did develop pneumonia and complicated by acute hypoxic respiratory failure and was treated with IV antibiotics and responded well with management and was discharged stable not on oxygen with plan for follow-up outpatient with oncology and primary care physician. Pt has loss of apetite and has had weight loss because eating has caused abdominal pain.   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  >># Metastatic Castrate sensitive Prostate adenoca - High risk with PSA 50 at diagnosis and GG5 . On ADT +Darolutamide .  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  >>#Left thyroid with PET avidity:  Discussed with patient that due to radiographic testing, this is concerning  for possible malignancy. U/s guided [facility] and tissue was not diagnostic - Repeat Thyroid US - TSH and Free T4 WNL "  ED Course:   Vitals:   04/21/22 1251 04/21/22 1924 04/21/22 2315 04/21/22 2345  BP:  108/79 109/79 115/80  Pulse:  82 82 79  Resp:  _0 Temp:  97.9 F (36.6 C)    TempSrc:  Oral    SpO2:  100% 95% 94%  Weight: 90.3 kg     Height: _1  (1.803 m)     In the emergency room patient is alert awake oriented afebrile. O2 sat 94% on room air. CBC shows leukocytosis of 17.6 hemoglobin of 9.6 platelets of 417. CMP shows sodium 128 potassium 5.4 glucose 145 BUN 43 and creatinine of 3.08. Urinalysis shows nitrite negative large leukocytes WBCs more than 50 and admission requested for sepsis and associated urinary tract infection. CT done today shows multiple pulmonary nodules suggestive of metastatic disease, 3.2 cm abdominal aortic aneurysm. See complete report below. Patient was  given azithromycin for possible pneumonia. We will obtain a procalcitonin. Patient started on Rocephin for his urinary tract infection.  >>CT abd pelvis non contrast shows: IMPRESSION: 1. Significant enlargement of multiple bilateral pulmonary nodules with infiltrates in the lung bases. Significant interval enlargement over a short time frame suggest either infectious/inflammatory process or aggressive metastatic disease. 2. Possible hepatic  metastasis. 3. Distended gallbladder with thick wall, nonspecific. No stones identified. 4. Surgical absence of the right kidney.  Probable left renal cysts. 5. Low-attenuation lesions in the spleen are unchanged since prior study. 6. 3.2 cm abdominal aortic aneurysm. Recommend follow-up every 3 years. Reference: [facility] Radiol 6578;46:962-952. 7. Prominent enlargement of the prostate gland.   >> Colonoscopy 12/2020: - One 8 mm polyp in the cecum, removed with a hot snare. Resected and retrieved. - One 5 mm polyp in the cecum, removed with a cold snare. Resected and retrieved. - One 6 mm polyp in the ascending colon, removed with a hot snare. Incomplete resection. Resected tissue not retrieved. - Two 3 to 5 mm polyps in the ascending colon, removed with a cold snare. Resected and retrieved. 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 Biopsied. Tattooed. - Non-bleeding external and internal hemorrhoids.  >> EGD 12/2020: - Normal duodenal bulb and second portion of the duodenum. - Normal stomach. Biopsied. - Esophagogastric landmarks identified. - Normal gastroesophageal junction and esophagus.  Review of Systems:  Review of Systems  Constitutional:  Positive for malaise/fatigue and weight loss.  Gastrointestinal:  Positive for abdominal pain.  Genitourinary:  Positive for dysuria.    Past Medical History:  Diagnosis Date   Hyperlipidemia    Hypertension    Prostate enlargement    Sleep apnea    resolved with weight loss    Past Surgical History:  Procedure Laterality Date   COLONOSCOPY N/A 02/20/2015   Procedure: COLONOSCOPY;  Surgeon: Robert Bellow, MD;  Location: [facility] ENDOSCOPY;  Service: Endoscopy;  Laterality: N/A;   COLONOSCOPY WITH PROPOFOL N/A 01/23/2021   Procedure: COLONOSCOPY WITH PROPOFOL;  Surgeon: Lin Landsman, MD;  Location: [facility] ENDOSCOPY;  Service: Gastroenterology;  Laterality: N/A;   CYST REMOVAL HAND  [id number]   ESOPHAGOGASTRODUODENOSCOPY  (EGD) WITH PROPOFOL N/A 01/23/2021   Procedure: ESOPHAGOGASTRODUODENOSCOPY (EGD) WITH PROPOFOL;  Surgeon: Lin Landsman, MD;  Location: [place];  Service: Gastroenterology;  Laterality: N/A;   JOINT REPLACEMENT     hip   PROSTATE BIOPSY  2000?   XI ROBOTIC ASSISTED SIMPLE PROSTATECTOMY N/A 01/14/2017   Procedure: XI ROBOTIC ASSISTED SIMPLE PROSTATECTOMY;  Surgeon: Alexis Frock, MD;  Location: [facility] ORS;  Service: Urology;  Laterality: N/A;     reports that he has quit smoking. His smoking use included cigarettes. He has a 15.00 pack-year smoking history. He has never used smokeless tobacco. He reports that he does not drink alcohol and does not use drugs.  Allergies  Allergen Reactions   Iodinated Contrast Media Swelling    Family History  Problem Relation Age of Onset   Cancer Father        Brain    Prior to Admission medications   Medication Sig Start Date End Date Taking? Authorizing Provider  amLODipine (NORVASC) 5 MG tablet Take 5 mg by mouth daily.    [provider]  amoxicillin-clavulanate (AUGMENTIN) 500-125 MG tablet Take 1 tablet (500 mg total) by mouth 2 (two) times daily. 01/25/21   Jennye Boroughs, MD  atorvastatin (LIPITOR) 80 MG tablet Take 80 mg by mouth daily.  [provider]  lisinopril-hydrochlorothiazide (PRINZIDE,ZESTORETIC) 20-25 MG per tablet Take 1 tablet by mouth daily at 12 noon.  01/02/15   [provider]  omeprazole (PRILOSEC) 20 MG capsule Take 20 mg by mouth daily.    [provider]  oxyCODONE (ROXICODONE) 5 MG immediate release tablet Take 1 tablet (5 mg total) by mouth every 8 (eight) hours as needed. 04/06/22 04/06/23  Vladimir Crofts, MD  oxyCODONE-acetaminophen (PERCOCET/ROXICET) 5-325 MG tablet Take 1 tablet by mouth every 8 (eight) hours as needed for severe pain. 03/04/22   Delman Kitten, MD    Physical Exam: Vitals:   04/21/22 1251 04/21/22 1924 04/21/22 2315 04/21/22 2345  BP:  108/79 109/79 115/80   Pulse:  82 82 79  Resp:  _0 Temp:  97.9 F (36.6 C)    TempSrc:  Oral    SpO2:  100% 95% 94%  Weight: 90.3 kg     Height: _1  (1.803 m)      Physical Exam Vitals and nursing note reviewed.  Constitutional:      General: He is not in acute distress.    Appearance: Normal appearance. He is not ill-appearing, toxic-appearing or diaphoretic.  HENT:     Head: Normocephalic and atraumatic.     Right Ear: Hearing and external ear normal.     Left Ear: Hearing and external ear normal.     Nose: Nose normal. No nasal deformity.     Mouth/Throat:     Lips: Pink.     Mouth: Mucous membranes are moist.     Tongue: No lesions.     Pharynx: Oropharynx is clear.  Eyes:     Extraocular Movements: Extraocular movements intact.     Pupils: Pupils are equal, round, and reactive to light.  Neck:     Vascular: No carotid bruit.  Cardiovascular:     Rate and Rhythm: Normal rate and regular rhythm.     Pulses: Normal pulses.     Heart sounds: Normal heart sounds.  Pulmonary:     Effort: Pulmonary effort is normal.     Breath sounds: Normal breath sounds.  Abdominal:     General: Bowel sounds are normal. There is no distension.     Palpations: Abdomen is soft. There is no mass.     Tenderness: There is no abdominal tenderness. There is no guarding.     Hernia: No hernia is present.  Musculoskeletal:     Right lower leg: No edema.     Left lower leg: No edema.  Skin:    General: Skin is warm.  Neurological:     General: No focal deficit present.     Mental Status: He is alert and oriented to person, place, and time.     Cranial Nerves: Cranial nerves 2-12 are intact.     Motor: Motor function is intact.  Psychiatric:        Attention and Perception: Attention normal.        Mood and Affect: Mood normal.        Speech: Speech normal.        Behavior: Behavior normal. Behavior is cooperative.        Cognition and Memory: Cognition normal.    Labs on Admission: I have  personally reviewed following labs and imaging studies BMET Recent Labs  Lab 04/21/22 1249  NA 128*  K 5.4*  CL 96*  CO2 21*  BUN 43*  CREATININE 3.08*  GLUCOSE 145*  Electrolytes Recent Labs  Lab 04/21/22 1249  CALCIUM 9.9   Sepsis Markers Recent Labs  Lab 04/21/22 1249 04/21/22 2234 04/22/22 0007  LATICACIDVEN  --  1.2  --   PROCALCITON 1.91  --  1.86   ABG No results for input(s): "PHART", "PCO2ART", "PO2ART" in the last 168 hours. Liver Enzymes Recent Labs  Lab 04/21/22 1249  AST 20  ALT 25  ALKPHOS 122  BILITOT 1.0  ALBUMIN 2.6*   Cardiac Enzymes No results for input(s): "TROPONINI", "PROBNP" in the last 168 hours. No results found for: "DDIMER" Coag's No results for input(s): "APTT", "INR" in the last 168 hours.  No results found for this or any previous visit (from the past 240 hour(s)).   Current Facility-Administered Medications:    atorvastatin (LIPITOR) tablet 80 mg, 80 mg, Oral, Daily, Posey Pronto, Gretta Cool, MD   azithromycin (ZITHROMAX) 500 mg in sodium chloride 0.9 % 250 mL IVPB, 500 mg, Intravenous, Once, Vladimir Crofts, MD, Last Rate: 250 mL/hr at 04/22/22 0131, 500 mg at 04/22/22 0131   azithromycin (ZITHROMAX) 500 mg in sodium chloride 0.9 % 250 mL IVPB, 500 mg, Intravenous, Q24H, Jerik Falletta V, MD   cefTRIAXone (ROCEPHIN) 1 g in sodium chloride 0.9 % 100 mL IVPB, 1 g, Intravenous, Q24H, Lilyanna Lunt V, MD   insulin aspart (novoLOG) injection 0-9 Units, 0-9 Units, Subcutaneous, TID WC, Para Skeans, MD   lactated ringers infusion, , Intravenous, Continuous, Florina Ou V, MD, Last Rate: 75 mL/hr at 04/22/22 0200, New Bag at 04/22/22 0200   oxyCODONE (Oxy IR/ROXICODONE) immediate release tablet 5 mg, 5 mg, Oral, Q8H PRN, Para Skeans, MD   oxyCODONE-acetaminophen (PERCOCET/ROXICET) 5-325 MG per tablet 1 tablet, 1 tablet, Oral, Q8H PRN, Florina Ou V, MD   sodium bicarbonate tablet 650 mg, 650 mg, Oral, TID, Carlisha Wisler V, MD   sodium chloride flush  (NS) 0.9 % injection 3 mL, 3 mL, Intravenous, Q12H, Florina Ou V, MD  Current Outpatient Medications:    amLODipine (NORVASC) 5 MG tablet, Take 5 mg by mouth daily., Disp: , Rfl:    amoxicillin-clavulanate (AUGMENTIN) 500-125 MG tablet, Take 1 tablet (500 mg total) by mouth 2 (two) times daily., Disp: 8 tablet, Rfl: 0   atorvastatin (LIPITOR) 80 MG tablet, Take 80 mg by mouth daily., Disp: , Rfl:    lisinopril-hydrochlorothiazide (PRINZIDE,ZESTORETIC) 20-25 MG per tablet, Take 1 tablet by mouth daily at 12 noon. , Disp: , Rfl:    omeprazole (PRILOSEC) 20 MG capsule, Take 20 mg by mouth daily., Disp: , Rfl:    oxyCODONE (ROXICODONE) 5 MG immediate release tablet, Take 1 tablet (5 mg total) by mouth every 8 (eight) hours as needed., Disp: 10 tablet, Rfl: 0   oxyCODONE-acetaminophen (PERCOCET/ROXICET) 5-325 MG tablet, Take 1 tablet by mouth every 8 (eight) hours as needed for severe pain., Disp: 12 tablet, Rfl: 0  COVID-19 Labs No results for input(s): "DDIMER", "FERRITIN", "LDH", "CRP" in the last 72 hours. Lab Results  Component Value Date   [place] NEGATIVE 01/19/2021    Radiological Exams on Admission: CT Renal Stone Study  Result Date: 04/22/2022 CLINICAL DATA:  Urinary tract infection. Abdominal pain. Sepsis. Abdominal pain, nausea, and vomiting. EXAM: CT ABDOMEN AND PELVIS WITHOUT CONTRAST TECHNIQUE: Multidetector CT imaging of the abdomen and pelvis was performed following the standard protocol without IV contrast. RADIATION DOSE REDUCTION: This exam was performed according to the departmental dose-optimization program which includes automated exposure control, adjustment of the mA and/or kV according to  patient size and/or use of iterative reconstruction technique. COMPARISON:  04/06/2022 FINDINGS: Lower chest: Patchy areas of consolidation in the lung bases. Multiple pulmonary nodules are demonstrated bilaterally. Largest in the left lung base measures 1.9 cm diameter. This  represents significant enlargement since the previous study at which time it measured 7 mm. Rapid growth over this short time interval would suggest inflammatory process or very aggressive metastatic disease. Hepatobiliary: Several circumscribed low-attenuation lesions demonstrated in the dome of the liver. Lesions are indeterminate on noncontrast imaging and could also represent infectious or neoplastic process. Gallbladder is moderately distended with wall thickening. No stones. Pancreas: Unremarkable. No pancreatic ductal dilatation or surrounding inflammatory changes. Spleen: Heterogeneous low-attenuation areas in the spleen suggesting cyst, mass or infarct. Similar appearance to previous study. Adrenals/Urinary Tract: No adrenal gland nodules. Surgical absence of the right kidney. Low-attenuation lesions on the left kidney probably represent cysts. No change since prior studies. No hydronephrosis or hydroureter. No ureteral stones. Bladder wall is thickened, possibly indicating cystitis. Stomach/Bowel: Stomach, small bowel, and colon are not abnormally distended. Stool fills the colon. There is a periumbilical hernia which contains some small bowel. No proximal obstruction. Appendix is normal. Vascular/Lymphatic: Calcification of the aorta. 3.2 cm diameter abdominal aortic aneurysm without change. No significant lymphadenopathy. Reproductive: Visualization of the low pelvis is limited by streak artifact from a left hip arthroplasty. The prostate gland appears severely enlarged, measuring up to about 7.5 cm diameter. Other: No free air or free fluid in the abdomen. Musculoskeletal: Postoperative left hip arthroplasty. Degenerative changes in the spine and right hip. No bone metastases are suggested. IMPRESSION: 1. Significant enlargement of multiple bilateral pulmonary nodules with infiltrates in the lung bases. Significant interval enlargement over a short time frame suggest either infectious/inflammatory  process or aggressive metastatic disease. 2. Possible hepatic metastasis. 3. Distended gallbladder with thick wall, nonspecific. No stones identified. 4. Surgical absence of the right kidney.  Probable left renal cysts. 5. Low-attenuation lesions in the spleen are unchanged since prior study. 6. 3.2 cm abdominal aortic aneurysm. Recommend follow-up every 3 years. Reference: J Am Coll Radiol 6256;38:937-342. 7. Prominent enlargement of the prostate gland. Electronically Signed   By: Lucienne Capers M.D.   On: 04/22/2022 00:10    EKG: Independently reviewed.  None    Assessment and Plan: * Abdominal pain D/D include Cancer related pain/ UTI. We will continue with prn pain meds.  D/W daughter that we are limited for ct as it is noncontrast.    Sepsis secondary to UTI [facility]) We will continue iv abx with rocephin and follow culture. MIVF. Prn antipyretic.   Pneumonia Mild PCL elevation.  We will continue azithromycin.  Hyponatremia Attribute to dehydration and decreased PO intake and HCTZ. Which I have held.  Cont on MIVF.  Hyperkalemia    Latest Ref Rng & Units 04/21/2022   12:49 PM 04/06/2022    3:30 AM 04/06/2022   12:38 AM  BMP  Glucose 70 - 99 mg/dL 145  127  128   BUN 8 - 23 mg/dL 43  46  47   Creatinine 0.61 - 1.24 mg/dL 3.08  3.22  3.59   Sodium 135 - 145 mmol/L 128  130  128   Potassium 3.5 - 5.1 mmol/L 5.4  5.0  5.5   Chloride 98 - 111 mmol/L 96  105  99   CO2 22 - 32 mmol/L _0 Calcium 8.9 - 10.3 mg/dL 9.9  8.8  10.1  Mild hyperkalemia due to CKD. We will start pt on po sodium bicarb tabs.  We will hold lisinopril.     Stage 3b chronic kidney disease ([place]) Lab Results  Component Value Date   CREATININE 3.08 (H) 04/21/2022   CREATININE 3.22 (H) 04/06/2022   CREATININE 3.59 (H) 04/06/2022  mild hyperkalemia,we will hold ACEI. Avoid contrast studies.  Renally dose all needed abx.    Essential hypertension Blood pressure 115/80, pulse 79, temperature  97.9 F (36.6 C), temperature source Oral, resp. rate 17, height _0  (1.803 m), weight 90.3 kg, SpO2 94 %. Home meds held due to hypotension.  MIVF.    DVT prophylaxis:  SCD's    Code Status:  Full code     Family Communication:  Andris, Brothers (Spouse)  [phone] (Home Phone)   Disposition Plan:  TBD.   Consults called:  None   Admission status: Inpatient.     Para Skeans MD [facility]  6 PM- 2 AM. Please contact me via secure Chat 6 PM-2 AM. [phone] ( Pager ) To contact the [facility] Attending or Consulting provider [place] or covering provider during after hours [place], for this patient.   Check the care team in [facility] and look for a) attending/consulting [facility] provider listed and b) the [facility] team listed Log into [email or web address] and use [place]'s universal password to access. If you do not have the password, please contact the hospital operator. Locate the [facility] provider you are looking for under [facility] and page to a number that you can be directly reached. If you still have difficulty reaching the provider, please page the [facility] (Director on Call) for the Hospitalists listed on amion for assistance. [email or web address] 04/22/2022, 2:25 AM

## 2022-04-22 NOTE — Progress Notes (Signed)
CODE SEPSIS - PHARMACY COMMUNICATION  **Broad Spectrum Antibiotics should be administered within 1 hour of Sepsis diagnosis**  Time Code Sepsis Called/Page Received: 1368  Antibiotics Ordered: Ceftriaxone & Azithromycin  Time of 1st antibiotic administration: 0036  Renda Rolls, PharmD, [facility] 04/22/2022 12:37 AM

## 2022-04-22 NOTE — Assessment & Plan Note (Addendum)
Worsening lower lung field nodule and infiltrate.  But patient does not have any respiratory symptoms.  History of recent pneumonia which has been treated.  Procalcitonin elevated with a small downward trend. -CT chest for better characterization -Continue ceftriaxone and Zithromax for now

## 2022-04-22 NOTE — Assessment & Plan Note (Addendum)
Lab Results  Component Value Date   CREATININE 3.08 (H) 04/21/2022   CREATININE 3.22 (H) 04/06/2022   CREATININE 3.59 (H) 04/06/2022  mild hyperkalemia, and ACE inhibitor were held. Avoid contrast studies.  Renally dose all needed abx.

## 2022-04-22 NOTE — Hospital Course (Signed)
Taken from H&P.  Gregory Ramos is a 81 y.o. 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  Pain is mostly in lower abdomen and and also associated dysuria.  It is intermittent and crampy in nature.  Also complained of poor appetite and weight loss due to poor p.o. intake. Denies any fever, chills or upper respiratory symptoms.  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  Patient did develop pneumonia and complicated by acute hypoxic respiratory failure and was treated with IV antibiotics and responded well with management and was discharged stable not on oxygen with plan for follow-up outpatient with oncology and primary care physician.  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   >># Metastatic Castrate sensitive Prostate adenoca - High risk with PSA 50 at diagnosis and GG5 . On ADT +Darolutamide . 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   >>#Left thyroid with PET avidity:  Discussed with patient that due to radiographic testing, this is concerning  for possible malignancy. [facility] and tissue was not diagnostic - Repeat Thyroid US - TSH and Free T4 WNL "  ED course.  Patient was hemodynamically stable, labs pertinent for leukocytosis at 17.6, mild hematuria, pyuria and bacteriuria.  Preliminary blood cultures negative.  Procalcitonin at 1.91, sodium of 128, potassium of 5.4 CT renal stone study with, creatinine of 3.08 which seems close to his baseline based on CKD stage IV. 1. Significant enlargement of multiple bilateral pulmonary nodules with infiltrates in the lung bases. Significant interval enlargement over a short time frame suggest either infectious/inflammatory process or aggressive metastatic disease. 2. Possible hepatic metastasis. 3. Distended gallbladder with thick wall, nonspecific. No stones identified. 4. Surgical absence of the right kidney.  Probable left renal cysts. 5. Low-attenuation lesions in the spleen are unchanged since prior study. 6. 3.2 cm abdominal aortic aneurysm. Recommend follow-up every 3 years.  Patient initially treated as sepsis and received broad-spectrum antibiotics, continued on ceftriaxone for concern of UTI.  7/25: Sepsis ruled out as patient never met sepsis criteria, only has leukocytosis.  Preliminary blood cultures negative.  Slight improvement in leukocytosis but all cell lines decreased as patient received IV fluid.  No upper respiratory symptoms, so less likely pneumonia but there  was worsening pulmonary nodules with infiltrates in the lung bases.  CT chest was ordered for better characterization, concern of disease progression. Urine  cultures with E. Coli  7/26: Urine cultures growing E. coli, pending susceptibility.  Blood cultures remain negative. CT chest with 1. Numerous bilateral solid pulmonary nodules which are new when compared with May 26, 2021 prior and have enlarged when compared with April 06, 2022 abdomen and pelvis CT. Findings are concerning for metastatic disease given history of colon cancer. 2. Additional bilateral solid pulmonary nodules which are unchanged in size when compared with January 24, 2021 likely due to benign etiology.  For concern of disease progression, discussed with daughter and palliative care was consulted for goals of care discussion. Patient is very high risk for deterioration and death based on age and underlying life limiting comorbidities.  Patient wants to be transferred to [facility] but they are not accepting any transfers at this time. Continues to have dysuria-Pyridium is contraindicated based on renal function, will try NSAID.  7/27: Patient remained stable, lower abdominal pain and dysuria improving.  Urine cultures with E. coli resistant to Bactrim, Cipro and ampicillin.  Patient received ceftriaxone while in the hospital and discharged on Keflex to complete a 7-day course.  For concern of disease progression and lung metastatic disease he was advised to have a close follow-up with his oncologist for further recommendations.  Patient will continue current medications and will follow-up with his providers.

## 2022-04-23 ENCOUNTER — Encounter: Payer: Self-pay | Admitting: Internal Medicine

## 2022-04-23 DIAGNOSIS — A419 Sepsis, unspecified organism: Secondary | ICD-10-CM

## 2022-04-23 DIAGNOSIS — N390 Urinary tract infection, site not specified: Secondary | ICD-10-CM | POA: Diagnosis not present

## 2022-04-23 LAB — PROCALCITONIN: Procalcitonin: 1.53 ng/mL

## 2022-04-23 LAB — CBC
HCT: 30.6 % — ABNORMAL LOW (ref 39.0–52.0)
Hemoglobin: 9.4 g/dL — ABNORMAL LOW (ref 13.0–17.0)
MCH: 24.9 pg — ABNORMAL LOW (ref 26.0–34.0)
MCHC: 30.7 g/dL (ref 30.0–36.0)
MCV: 81.2 fL (ref 80.0–100.0)
Platelets: 380 10*3/uL (ref 150–400)
RBC: 3.77 MIL/uL — ABNORMAL LOW (ref 4.22–5.81)
RDW: 16.2 % — ABNORMAL HIGH (ref 11.5–15.5)
WBC: 13.3 10*3/uL — ABNORMAL HIGH (ref 4.0–10.5)
nRBC: 0 % (ref 0.0–0.2)

## 2022-04-23 LAB — GLUCOSE, CAPILLARY
Glucose-Capillary: 115 mg/dL — ABNORMAL HIGH (ref 70–99)
Glucose-Capillary: 118 mg/dL — ABNORMAL HIGH (ref 70–99)
Glucose-Capillary: 114 mg/dL — ABNORMAL HIGH (ref 70–99)
Glucose-Capillary: 130 mg/dL — ABNORMAL HIGH (ref 70–99)
Glucose-Capillary: 140 mg/dL — ABNORMAL HIGH (ref 70–99)

## 2022-04-23 LAB — BASIC METABOLIC PANEL
Anion gap: 10 (ref 5–15)
BUN: 38 mg/dL — ABNORMAL HIGH (ref 8–23)
CO2: 20 mmol/L — ABNORMAL LOW (ref 22–32)
Calcium: 9.4 mg/dL (ref 8.9–10.3)
Chloride: 100 mmol/L (ref 98–111)
Creatinine, Ser: 2.62 mg/dL — ABNORMAL HIGH (ref 0.61–1.24)
GFR, Estimated: 24 mL/min — ABNORMAL LOW (ref >60)
Glucose, Bld: 112 mg/dL — ABNORMAL HIGH (ref 70–99)
Potassium: 4.6 mmol/L (ref 3.5–5.1)
Sodium: 130 mmol/L — ABNORMAL LOW (ref 135–145)

## 2022-04-23 MED ORDER — IBUPROFEN 400 MG PO TABS: 400.0000 mg | ORAL_TABLET | Freq: Four times a day (QID) | ORAL | Status: DC | PRN

## 2022-04-23 MED ORDER — ENSURE ENLIVE PO LIQD
237.0000 mL | Freq: Three times a day (TID) | ORAL | Status: DC
  Administered 2022-04-23 – 2022-04-24 (×3): 237 mL via ORAL

## 2022-04-23 MED ORDER — ADULT MULTIVITAMIN W/MINERALS CH
1.0000 | ORAL_TABLET | Freq: Every day | ORAL | Status: DC
  Administered 2022-04-23 – 2022-04-24 (×2): 1 via ORAL
  Filled 2022-04-23 (×2): qty 1

## 2022-04-23 NOTE — Plan of Care (Signed)
  Problem: Fluid Volume: Goal: Hemodynamic stability will improve Outcome: Progressing   Problem: Clinical Measurements: Goal: Diagnostic test results will improve Outcome: Progressing Goal: Signs and symptoms of infection will decrease Outcome: Progressing   Problem: Respiratory: Goal: Ability to maintain adequate ventilation will improve Outcome: Progressing   Problem: Coping: Goal: Ability to adjust to condition or change in health will improve Outcome: Progressing   Problem: Fluid Volume: Goal: Ability to maintain a balanced intake and output will improve Outcome: Progressing

## 2022-04-23 NOTE — Assessment & Plan Note (Signed)
Lab Results  Component Value Date   CREATININE 2.62 (H) 04/23/2022   CREATININE 2.89 (H) 04/22/2022   CREATININE 3.08 (H) 04/21/2022  mild hyperkalemia, and ACE inhibitor were held. Avoid contrast studies.  Renally dose all needed abx.

## 2022-04-23 NOTE — Assessment & Plan Note (Addendum)
Sepsis ruled out as patient only has leukocytosis and does not meet the criteria for sepsis. UA concerning for UTI with positive symptoms of lower abdominal pain and dysuria. Urine cultures with E. coli-pending susceptibility -Continue ceftriaxone

## 2022-04-23 NOTE — Assessment & Plan Note (Signed)
Blood pressure within goal.  Home antihypertensives were initially held due to concern of softer blood pressure. -Continue holding home antihypertensives-we will resume as needed

## 2022-04-23 NOTE — Assessment & Plan Note (Signed)
Worsening lower lung field nodule and infiltrate.  But patient does not have any respiratory symptoms.  History of recent pneumonia which has been treated.  Procalcitonin elevated with a small downward trend. -CT chest for better characterization-shows worsening of multiple nodules concerning for metastatic disease. -Continue ceftriaxone and Zithromax for now

## 2022-04-23 NOTE — Progress Notes (Signed)
Initial Nutrition Assessment  DOCUMENTATION CODES:   Not applicable  INTERVENTION:   -Liberalize diet to regular for wider variety of meal selections -Ensure Enlive po TID, each supplement provides 350 kcal and 20 grams of protein -MVI with minerals daily -Magic cup TID with meals, each supplement provides 290 kcal and 9 grams of protein   NUTRITION DIAGNOSIS:   Inadequate oral intake related to poor appetite as evidenced by per patient/family report.  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  Pain is mostly in lower abdomen and and also associated dysuria.  It is intermittent and crampy in nature.  Also complained of poor appetite and weight loss due to poor p.o. intake.  Pt admitted with abdominal pain and sepsis related to UTI.   Reviewed I/O's: +318 ml x 24 hours  UOP: 750 ml x 24 hours   Spoke with pt at bedside. Pt with extreme abdominal pain at time of visit, so only able to give limited history. Pt reports he was able to eat "a few bites" of food today. Noted meal completions 0-60%. Unable to provide diet history PTA.   Reviewed wt hx; pt has experienced a 14.6% wt loss over the past 2 months, which is significant for time frame.   Medications reviewed and include miralax.   Lab Results  Component Value Date   HGBA1C 6.5 (H) 04/22/2022   PTA DM medications are none.   Labs reviewed: CBGS: [id number]  (inpatient orders for glycemic control are 0-9 units insulin aspart TID with meals).    NUTRITION - FOCUSED PHYSICAL EXAM:  Flowsheet Row Most Recent Value  Orbital Region No depletion  Upper Arm Region No depletion  Thoracic and Lumbar Region No depletion  Buccal Region No depletion  Temple Region Mild depletion  Clavicle Bone Region No depletion  Clavicle and Acromion Bone Region No depletion  Scapular Bone Region No depletion  Dorsal Hand No depletion  Patellar Region Mild depletion  Anterior Thigh Region Mild depletion  Posterior Calf Region Mild depletion  Edema (RD Assessment) None  Hair Reviewed  Eyes Reviewed  Mouth Reviewed  Skin Reviewed  Nails Reviewed       Diet Order:   Diet Order             Diet Carb Modified Fluid consistency: Thin; Room service appropriate? Yes  Diet effective now                   EDUCATION NEEDS:   No education needs have been identified at this time  Skin:  Skin Assessment: Reviewed RN Assessment  Last BM:  Unknown  Height:   Ht Readings from Last 1 Encounters:  04/23/22 '5\' 11"'$  (1.803 m)    Weight:   Wt Readings from Last 1 Encounters:  04/23/22 87.4 kg    Ideal Body Weight:  78.2 kg  BMI:  Body mass index is 26.87 kg/m.  Estimated Nutritional Needs:   Kcal:  2200-2400  Protein:  115-130 grams  Fluid:  > 2 L    Loistine Chance, RD, LDN, [place] Registered Dietitian II Certified Diabetes Care and Education Specialist Please refer to [facility] for RD and/or RD on-call/weekend/after hours pager

## 2022-04-23 NOTE — Assessment & Plan Note (Signed)
Resolved.  Continue to monitor.

## 2022-04-23 NOTE — Assessment & Plan Note (Signed)
D/D include Cancer related pain/ UTI. We will continue with prn pain meds.  D/W daughter that we are limited for ct as it is noncontrast.

## 2022-04-23 NOTE — Progress Notes (Signed)
Progress Note   [name] ACZ:[id number] DOB: [birth year] DOA: 04/21/2022     1 DOS: the patient was seen and examined on 04/23/2022   Brief hospital course: Taken from H&P.  Gregory Ramos is a 81 y.o. 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  Pain is mostly in lower abdomen and and also associated dysuria.  It is intermittent and crampy in nature.  Also complained of poor appetite and weight loss due to poor p.o. intake. Denies any fever, chills or upper respiratory symptoms.  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  Patient did develop pneumonia and complicated by acute hypoxic respiratory failure and was treated with IV antibiotics and responded well with management and was discharged stable not on oxygen with plan for follow-up outpatient with oncology and primary care physician.  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   >># Metastatic Castrate sensitive Prostate adenoca - High risk with PSA 50 at diagnosis and GG5 . On ADT +Darolutamide . 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   >>#Left thyroid with PET avidity:  Discussed with patient that due to radiographic testing, this is concerning  for possible malignancy. [facility] and tissue was not diagnostic - Repeat Thyroid US - TSH and Free T4 WNL "  ED course.  Patient was hemodynamically stable, labs pertinent for leukocytosis at 17.6, mild hematuria, pyuria and bacteriuria.  Preliminary blood cultures negative.  Procalcitonin at 1.91, sodium of 128, potassium of 5.4 CT renal stone study with, creatinine of 3.08 which seems close to his baseline based on CKD stage IV. 1. Significant enlargement of multiple bilateral pulmonary nodules with infiltrates in the lung bases. Significant interval enlargement over a short time frame suggest either infectious/inflammatory process or aggressive metastatic disease. 2. Possible hepatic metastasis. 3. Distended gallbladder with thick wall, nonspecific. No stones identified. 4. Surgical absence of the right kidney.  Probable left renal cysts. 5. Low-attenuation lesions in the spleen are unchanged since prior study. 6. 3.2 cm abdominal aortic aneurysm. Recommend follow-up every 3 years.  Patient initially treated as sepsis and received broad-spectrum antibiotics, continued on ceftriaxone for concern of UTI.  7/25: Sepsis ruled out as patient never met sepsis criteria, only has  leukocytosis.  Preliminary blood cultures negative.  Slight improvement in leukocytosis but all cell lines decreased as patient received IV fluid.  No upper respiratory symptoms, so less likely  pneumonia but there was worsening pulmonary nodules with infiltrates in the lung bases.  CT chest was ordered for better characterization, concern of disease progression. Urine cultures with E. Coli  7/26: Urine cultures growing E. coli, pending susceptibility.  Blood cultures remain negative. CT chest with 1. Numerous bilateral solid pulmonary nodules which are new when compared with May 26, 2021 prior and have enlarged when compared with April 06, 2022 abdomen and pelvis CT. Findings are concerning for metastatic disease given history of colon cancer. 2. Additional bilateral solid pulmonary nodules which are unchanged in size when compared with January 24, 2021 likely due to benign etiology.  For concern of disease progression, discussed with daughter and palliative care was consulted for goals of care discussion. Patient is very high risk for deterioration and death based on age and underlying life limiting comorbidities.  Patient wants to be transferred to [facility] but they are not accepting any transfers at this time. Continues to have dysuria-Pyridium is contraindicated based on renal function, will try NSAID.    Assessment and Plan: * Sepsis secondary to UTI ([place]) Sepsis ruled out as patient only has leukocytosis and does not meet the criteria for sepsis. UA concerning for UTI with positive symptoms of lower abdominal pain and dysuria. Urine cultures with E. coli-pending susceptibility -Continue ceftriaxone  Abdominal pain D/D include Cancer related pain/ UTI. We will continue with prn pain meds.  D/W daughter that we are limited for ct as it is noncontrast.    Pneumonia Worsening lower lung field nodule and infiltrate.  But patient does not have any respiratory symptoms.  History of recent pneumonia which has been treated.  Procalcitonin elevated with a small downward trend. -CT chest for better characterization-shows worsening of multiple nodules concerning for metastatic  disease. -Continue ceftriaxone and Zithromax for now  Hyperkalemia Resolved.  -Continue to monitor   Hyponatremia Initially attributed to poor p.o. intake with slight improvement 130 with IV fluid. 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 -Continue to monitor  Essential hypertension Blood pressure within goal.  Home antihypertensives were initially held due to concern of softer blood pressure. -Continue holding home antihypertensives-we will resume as needed  Stage IV chronic kidney disease ([place]) Lab Results  Component Value Date   CREATININE 2.62 (H) 04/23/2022   CREATININE 2.89 (H) 04/22/2022   CREATININE 3.08 (H) 04/21/2022  mild hyperkalemia, and ACE inhibitor were held. Avoid contrast studies.  Renally dose all needed abx.      Subjective: Patient was seen and examined today.  He was complaining of burning micturition and dysuria.  Physical Exam: Vitals:   04/23/22 0339 04/23/22 0745 04/23/22 1135 04/23/22 1458  BP: 140/70 137/70 103/72 110/75  Pulse: 93 92 89 86  Resp: 20 18 17 17   Temp: 98.4 F (36.9 C) 98.5 F (36.9 C) 98.8 F (37.1 C) 98.4 F (36.9 C)  TempSrc:   Oral   SpO2: 98% 97% 95% 100%  Weight:      Height:       General.  Ill-appearing elderly man, in no acute distress. Pulmonary.  Lungs clear bilaterally, normal respiratory effort. CV.  Regular rate and rhythm, no JVD, rub or murmur. Abdomen.  Soft, nontender, nondistended, BS positive. CNS.  Alert and oriented .  No focal neurologic deficit. Extremities.  No edema, no  cyanosis, pulses intact and symmetrical. Psychiatry.  Judgment and insight appears normal.  Data Reviewed: Prior data reviewed  Family Communication: Discussed with daughter on phone.  Disposition: Status is: Inpatient Remains inpatient appropriate because: Severity of illness   Planned Discharge Destination: Home  Time spent: 45 minutes  This record has been created using Therapist, sports. Errors have been sought and corrected,but may not always be located. Such creation errors do not reflect on the standard of care.  Author: Lorella Nimrod, MD 04/23/2022 4:48 PM  For on call review [email or web address].

## 2022-04-23 NOTE — Assessment & Plan Note (Signed)
Initially attributed to poor p.o. intake with slight improvement 130 with IV fluid. 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 -Continue to monitor

## 2022-04-24 DIAGNOSIS — A419 Sepsis, unspecified organism: Secondary | ICD-10-CM | POA: Diagnosis not present

## 2022-04-24 DIAGNOSIS — N390 Urinary tract infection, site not specified: Secondary | ICD-10-CM | POA: Diagnosis not present

## 2022-04-24 LAB — URINE CULTURE: Culture: >=100000 — AB

## 2022-04-24 LAB — GLUCOSE, CAPILLARY: Glucose-Capillary: 124 mg/dL — ABNORMAL HIGH (ref 70–99)

## 2022-04-24 MED ORDER — SODIUM BICARBONATE 650 MG PO TABS: 650.0000 mg | ORAL_TABLET | Freq: Three times a day (TID) | ORAL | 1 refills | Status: AC

## 2022-04-24 MED ORDER — POLYETHYLENE GLYCOL 3350 17 G PO PACK: 17.0000 g | PACK | Freq: Every day | ORAL | 0 refills | Status: DC

## 2022-04-24 MED ORDER — CEPHALEXIN 250 MG PO CAPS: 250.0000 mg | ORAL_CAPSULE | Freq: Three times a day (TID) | ORAL | 0 refills | Status: AC

## 2022-04-24 MED ORDER — CEPHALEXIN 250 MG PO CAPS
250.0000 mg | ORAL_CAPSULE | Freq: Three times a day (TID) | ORAL | Status: DC
Start: 2022-04-24 — End: 2022-04-24
  Administered 2022-04-24: 250 mg via ORAL
  Filled 2022-04-24: qty 1

## 2022-04-24 MED ORDER — ENSURE ENLIVE PO LIQD: 237.0000 mL | Freq: Three times a day (TID) | ORAL | 12 refills | Status: AC

## 2022-04-24 NOTE — Discharge Summary (Signed)
Physician Discharge Summary   [name] MRN: [id number] DOB: 23-Feb-1941  Admit date:     04/21/2022  Discharge date: 04/24/22  Discharge Physician: Lorella Nimrod   PCP: Annetta Maw, MD   Recommendations at discharge:   Please obtain CBC and BMP in one week. Follow-up with oncology within a week Follow-up with PCP within a week  Discharge Diagnoses: Principal Problem:   Sepsis secondary to UTI [facility]) Active Problems:   Abdominal pain   Pneumonia   Hyperkalemia   Hyponatremia   Essential hypertension   Stage IV chronic kidney disease [facility])   Hospital Course: Taken from H&P.  Gregory Ramos is a 81 y.o. 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  Pain is mostly in lower abdomen and and also associated dysuria.  It is intermittent and crampy in nature.  Also complained of poor appetite and weight loss due to poor p.o. intake. Denies any fever, chills or upper respiratory symptoms.  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  Patient did develop pneumonia and complicated by acute hypoxic respiratory failure and was treated with IV antibiotics and responded well with management and was discharged stable not on oxygen with plan for follow-up outpatient with oncology and primary care physician.  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   >># Metastatic Castrate sensitive Prostate adenoca - High risk with PSA 50 at diagnosis and GG5 . On ADT +Darolutamide . 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   >>#Left thyroid with PET avidity:  Discussed with patient that due to radiographic testing, this is concerning  for possible malignancy. U/s guided [facility] and tissue was not diagnostic - Repeat Thyroid US - TSH and Free T4 WNL "  ED course.  Patient was hemodynamically stable, labs pertinent for leukocytosis at 17.6, mild hematuria, pyuria and bacteriuria.  Preliminary blood cultures negative.  Procalcitonin at 1.91, sodium of 128, potassium of 5.4 CT renal stone study with, creatinine of 3.08 which seems close to his baseline based on CKD stage IV. 1. Significant enlargement of multiple bilateral pulmonary nodules with infiltrates in the lung bases. Significant interval enlargement over a short time frame suggest either infectious/inflammatory process or aggressive metastatic disease. 2. Possible hepatic metastasis.  3. Distended gallbladder with thick wall, nonspecific. No stones identified. 4. Surgical absence of the right kidney.  Probable left renal cysts. 5. Low-attenuation lesions in the spleen are unchanged since prior study. 6. 3.2 cm abdominal aortic aneurysm.  Recommend follow-up every 3 years.  Patient initially treated as sepsis and received broad-spectrum antibiotics, continued on ceftriaxone for concern of UTI.  7/25: Sepsis ruled out as patient never met sepsis criteria, only has leukocytosis.  Preliminary blood cultures negative.  Slight improvement in leukocytosis but all cell lines decreased as patient received IV fluid.  No upper respiratory symptoms, so less likely pneumonia but there was worsening pulmonary nodules with infiltrates in the lung bases.  CT chest was ordered for better characterization, concern of disease progression. Urine cultures with E. Coli  7/26: Urine cultures growing E. coli, pending susceptibility.  Blood cultures remain negative. CT chest with 1. Numerous bilateral solid pulmonary nodules which are new when compared with May 26, 2021 prior and have enlarged when compared with April 06, 2022 abdomen and pelvis CT. Findings are concerning for metastatic disease given history of colon cancer. 2. Additional bilateral solid pulmonary nodules which are unchanged in size when compared with January 24, 2021 likely due to benign etiology.  For concern of disease progression, discussed with daughter and palliative care was consulted for goals of care discussion. Patient is very high risk for deterioration and death based on age and underlying life limiting comorbidities.  Patient wants to be transferred to [facility] but they are not accepting any transfers at this time. Continues to have dysuria-Pyridium is contraindicated based on renal function, will try NSAID.  7/27: Patient remained stable, lower abdominal pain and dysuria improving.  Urine cultures with E. coli resistant to Bactrim, Cipro and ampicillin.  Patient received ceftriaxone while in the hospital and discharged on Keflex to complete a 7-day course.  For concern of disease progression and lung metastatic disease he was advised to have a close follow-up with his  oncologist for further recommendations.  Patient will continue current medications and will follow-up with his providers.  Assessment and Plan: * Sepsis secondary to UTI ([place]) Sepsis ruled out as patient only has leukocytosis and does not meet the criteria for sepsis. UA concerning for UTI with positive symptoms of lower abdominal pain and dysuria. Urine cultures with E. coli-pending susceptibility -Continue ceftriaxone  Abdominal pain D/D include Cancer related pain/ UTI. We will continue with prn pain meds.  D/W daughter that we are limited for ct as it is noncontrast.    Pneumonia Worsening lower lung field nodule and infiltrate.  But patient does not have any respiratory symptoms.  History of recent pneumonia which has been treated.  Procalcitonin elevated with a small downward trend. -CT chest for better characterization-shows worsening of multiple nodules concerning for metastatic disease. -Continue ceftriaxone and Zithromax for now  Hyperkalemia Resolved.  -Continue to monitor   Hyponatremia Initially attributed to poor p.o. intake with slight improvement 130 with IV fluid. 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 -Continue to monitor  Essential hypertension Blood pressure within goal.  Home antihypertensives were initially held due to concern of softer blood pressure. -Continue holding home antihypertensives-we will resume as needed  Stage IV chronic kidney disease ([place]) Lab Results  Component Value Date   CREATININE 2.62 (H) 04/23/2022   CREATININE 2.89 (H) 04/22/2022   CREATININE 3.08 (H) 04/21/2022  mild hyperkalemia, and ACE inhibitor were held. Avoid contrast studies.  Renally dose all needed abx.  Pain control - [employer] Controlled Substance Reporting System database was reviewed. and patient was instructed, not to drive, operate heavy machinery, perform activities at heights, swimming or participation in  water activities or provide baby-sitting services while on Pain, Sleep and Anxiety Medications; until their outpatient Physician has advised to do so again. Also recommended to not to take more than prescribed Pain, Sleep and Anxiety Medications.  Consultants: None Procedures performed: None Disposition: Home Diet recommendation:  Discharge Diet Orders (From admission, onward)     Start     Ordered   04/24/22 0000  Diet - low sodium heart healthy        04/24/22 1035           Cardiac diet DISCHARGE MEDICATION: Allergies as of 04/24/2022       Reactions   Iodinated Contrast Media Swelling        Medication List     STOP taking these medications    amoxicillin-clavulanate 500-125 MG tablet Commonly known as: Augmentin   lisinopril-hydrochlorothiazide 20-25 MG tablet Commonly known as: ZESTORETIC   oxyCODONE 5 MG immediate release tablet Commonly known as: Roxicodone       TAKE these medications    amLODipine 5 MG tablet Commonly known as: NORVASC Take 5 mg by mouth daily.   atorvastatin 80 MG tablet Commonly known as: LIPITOR Take 80 mg by mouth daily.   cephALEXin 250 MG capsule Commonly known as: KEFLEX Take 1 capsule (250 mg total) by mouth every 8 (eight) hours for 15 doses.   feeding supplement Liqd Take 237 mLs by mouth 3 (three) times daily between meals.   omeprazole 20 MG capsule Commonly known as: PRILOSEC Take 20 mg by mouth daily.   oxyCODONE-acetaminophen 5-325 MG tablet Commonly known as: PERCOCET/ROXICET Take 1 tablet by mouth every 8 (eight) hours as needed for severe pain.   polyethylene glycol 17 g packet Commonly known as: MIRALAX / GLYCOLAX Take 17 g by mouth daily.   sodium bicarbonate 650 MG tablet Take 1 tablet (650 mg total) by mouth 3 (three) times daily.        Follow-up Information     Annetta Maw, MD. Schedule an appointment as soon as possible for a visit in 1 week(s).   Specialty: Internal  Medicine Contact information: [address] [place] [address] [phone]         Wellington Hampshire, MD .   Specialty: Cardiology Contact information: [address] [place] [address] [phone]         Tomasita Crumble., MD. Schedule an appointment as soon as possible for a visit in 1 week(s).   Specialty: Hematology and Oncology Contact information: [place] [address] [phone]                Discharge Exam: [facility] Weights   04/21/22 1251 04/23/22 0054  Weight: 90.3 kg 87.4 kg   General.     In no acute distress. Pulmonary.  Lungs clear bilaterally, normal respiratory effort. CV.  Regular rate and rhythm, no JVD, rub or murmur. Abdomen.  Soft, nontender, nondistended, BS positive. CNS.  Alert and oriented .  No focal neurologic deficit. Extremities.  No edema, no cyanosis, pulses intact and symmetrical. Psychiatry.  Judgment and insight appears normal.   Condition at discharge: stable  The results of significant diagnostics from this hospitalization (including imaging, microbiology, ancillary and laboratory) are listed below for reference.   Imaging Studies: CT  CHEST WO CONTRAST  Result Date: 04/22/2022 CLINICAL DATA:  Lung opacities, history of colon cancer; * Tracking Code: BO * EXAM: CT CHEST WITHOUT CONTRAST TECHNIQUE: Multidetector CT imaging of the chest was performed following the standard protocol without IV contrast. RADIATION DOSE REDUCTION: This exam was performed according to the departmental dose-optimization program which includes automated exposure control, adjustment of the mA and/or kV according to patient size and/or use of iterative reconstruction technique. COMPARISON:  Chest CT dated January 24, 2021; abdomen and pelvis CT dated April 06, 2022 FINDINGS: Cardiovascular: Normal heart size. No pericardial effusion. Moderate left main and three-vessel coronary artery  calcifications. Normal caliber thoracic aorta with atherosclerotic disease. Mediastinum/Nodes: Esophagus and thyroid are unremarkable. No pathologically enlarged lymph nodes seen in the chest. Lungs/Pleura: Central airways are patent. Trace right pleural effusion. Numerous new large solid pulmonary nodules. Reference new nodule of the right middle lobe measuring 2.0 x 1 point 5 cm on series 3, image 94. Reference new nodule of the left upper lobe measuring 1.9 x 1.5 cm on series 3, image 63. Solid pulmonary nodules which are stable when compared with January 24, 2021 prior exam and likely due to benign etiology. Reference solid nodule of the right upper lobe measuring 5 mm on series 3, image 87, unchanged. Reference solid pulmonary nodule of the left upper lobe measuring 1.7 x 1.3 cm on series 3, image 47. Upper Abdomen: Stable scattered hypoattenuating hepatic lesions and simple appearing left renal cyst. Stable thickening of the bilateral adrenal glands no discrete nodules. Surgically absent right kidney. Stable splenic lesions. Musculoskeletal: No chest wall mass or suspicious bone lesions identified. IMPRESSION: 1. Numerous bilateral solid pulmonary nodules which are new when compared with May 26, 2021 prior and have enlarged when compared with April 06, 2022 abdomen and pelvis CT. Findings are concerning for metastatic disease given history of colon cancer. 2. Additional bilateral solid pulmonary nodules which are unchanged in size when compared with January 24, 2021 likely due to benign etiology. Electronically Signed   By: Yetta Glassman M.D.   On: 04/22/2022 16:50   CT Renal Stone Study  Result Date: 04/22/2022 CLINICAL DATA:  Urinary tract infection. Abdominal pain. Sepsis. Abdominal pain, nausea, and vomiting. EXAM: CT ABDOMEN AND PELVIS WITHOUT CONTRAST TECHNIQUE: Multidetector CT imaging of the abdomen and pelvis was performed following the standard protocol without IV contrast. RADIATION DOSE  REDUCTION: This exam was performed according to the departmental dose-optimization program which includes automated exposure control, adjustment of the mA and/or kV according to patient size and/or use of iterative reconstruction technique. COMPARISON:  04/06/2022 FINDINGS: Lower chest: Patchy areas of consolidation in the lung bases. Multiple pulmonary nodules are demonstrated bilaterally. Largest in the left lung base measures 1.9 cm diameter. This represents significant enlargement since the previous study at which time it measured 7 mm. Rapid growth over this short time interval would suggest inflammatory process or very aggressive metastatic disease. Hepatobiliary: Several circumscribed low-attenuation lesions demonstrated in the dome of the liver. Lesions are indeterminate on noncontrast imaging and could also represent infectious or neoplastic process. Gallbladder is moderately distended with wall thickening. No stones. Pancreas: Unremarkable. No pancreatic ductal dilatation or surrounding inflammatory changes. Spleen: Heterogeneous low-attenuation areas in the spleen suggesting cyst, mass or infarct. Similar appearance to previous study. Adrenals/Urinary Tract: No adrenal gland nodules. Surgical absence of the right kidney. Low-attenuation lesions on the left kidney probably represent cysts. No change since prior studies. No hydronephrosis or hydroureter. No ureteral stones. Bladder wall is  thickened, possibly indicating cystitis. Stomach/Bowel: Stomach, small bowel, and colon are not abnormally distended. Stool fills the colon. There is a periumbilical hernia which contains some small bowel. No proximal obstruction. Appendix is normal. Vascular/Lymphatic: Calcification of the aorta. 3.2 cm diameter abdominal aortic aneurysm without change. No significant lymphadenopathy. Reproductive: Visualization of the low pelvis is limited by streak artifact from a left hip arthroplasty. The prostate gland appears  severely enlarged, measuring up to about 7.5 cm diameter. Other: No free air or free fluid in the abdomen. Musculoskeletal: Postoperative left hip arthroplasty. Degenerative changes in the spine and right hip. No bone metastases are suggested. IMPRESSION: 1. Significant enlargement of multiple bilateral pulmonary nodules with infiltrates in the lung bases. Significant interval enlargement over a short time frame suggest either infectious/inflammatory process or aggressive metastatic disease. 2. Possible hepatic metastasis. 3. Distended gallbladder with thick wall, nonspecific. No stones identified. 4. Surgical absence of the right kidney.  Probable left renal cysts. 5. Low-attenuation lesions in the spleen are unchanged since prior study. 6. 3.2 cm abdominal aortic aneurysm. Recommend follow-up every 3 years. Reference: J Am Coll Radiol 9892;11:941-740. 7. Prominent enlargement of the prostate gland. Electronically Signed   By: Lucienne Capers M.D.   On: 04/22/2022 00:10   CT ABDOMEN PELVIS WO CONTRAST  Result Date: 04/06/2022 CLINICAL DATA:  Abdominal pain and emesis EXAM: CT ABDOMEN AND PELVIS WITHOUT CONTRAST TECHNIQUE: Multidetector CT imaging of the abdomen and pelvis was performed following the standard protocol without IV contrast. RADIATION DOSE REDUCTION: This exam was performed according to the departmental dose-optimization program which includes automated exposure control, adjustment of the mA and/or kV according to patient size and/or use of iterative reconstruction technique. COMPARISON:  03/04/2022 FINDINGS: Lower Chest: Bibasilar pulmonary nodules, the largest of which measures 11 mm (image 4). Hepatobiliary: Hypodense hepatic lesions are poorly visualized on this noncontrast study. Otherwise normal liver. No biliary dilatation. The gallbladder is normal. Pancreas: Normal pancreas. No ductal dilatation or peripancreatic fluid collection. Spleen: Cystic area in the spleen is unchanged.  Adrenals/Urinary Tract: The adrenal glands are unchanged with likely adenomatous hyperplasia. Multiple low-density left renal lesions, unchanged. These are characterized on outside MRI of 12/05/2021. The urinary bladder is normal for degree of distention Stomach/Bowel: There is no hiatal hernia. Normal duodenal course and caliber. No small bowel dilatation or inflammation. No focal colonic abnormality. Normal appendix. Vascular/Lymphatic: 3.2 cm abdominal aortic aneurysm, unchanged. Reproductive: Prostate gland is difficult to visualize due to streak artifacts from hip arthroplasty. Other: None. Musculoskeletal: Left hip arthroplasty. Multilevel degenerative disc and facet disease IMPRESSION: 1. No acute abnormality of the abdomen or pelvis. 2. Multiple pulmonary nodules. Most severe: 11 mm solid pulmonary nodule detected on incomplete chest CT. Recommend prompt non-contrast Chest CT for further evaluation. These guidelines do not apply to immunocompromised patients and patients with cancer. Follow up in patients with significant comorbidities as clinically warranted. For lung cancer screening, adhere to Lung-RADS guidelines. Reference: Radiology. 2017; 284(1):228-43. 3. Hypodense lesions of the liver poorly characterized on this non-contrast study. 4. 3.2 cm abdominal aortic aneurysm. Recommend follow-up every 3 years. Reference: J Am Coll Radiol 8144;81:856-314. Electronically Signed   By: Ulyses Jarred M.D.   On: 04/06/2022 02:31    Microbiology: Results for orders placed or performed during the hospital encounter of 04/21/22  Urine Culture     Status: Abnormal   Collection Time: 04/21/22 11:05 PM   Specimen: Urine, Clean Catch  Result Value Ref Range Status   Specimen Description  Final    URINE, CLEAN CATCH Performed at [facility], [address] [place], [place] [address]    Special Requests   Final    NONE Performed at [facility], [address] [place], [place]  [address]    Culture (A)  Final    >=100,000 COLONIES/mL ESCHERICHIA COLI Two isolates with different morphologies were identified as the same organism.The most resistant organism was reported. Performed at [facility], [place] [address], [place], [place] [address]    Report Status 04/24/2022 FINAL  Final   Organism ID, Bacteria ESCHERICHIA COLI (A)  Final      Susceptibility   Escherichia coli - MIC*    AMPICILLIN >=32 RESISTANT Resistant     CEFAZOLIN <=4 SENSITIVE Sensitive     CEFEPIME <=0.12 SENSITIVE Sensitive     CEFTRIAXONE <=0.25 SENSITIVE Sensitive     CIPROFLOXACIN >=4 RESISTANT Resistant     GENTAMICIN <=1 SENSITIVE Sensitive     IMIPENEM <=0.25 SENSITIVE Sensitive     NITROFURANTOIN <=16 SENSITIVE Sensitive     TRIMETH/SULFA >=320 RESISTANT Resistant     AMPICILLIN/SULBACTAM >=32 RESISTANT Resistant     PIP/TAZO 16 SENSITIVE Sensitive     * >=100,000 COLONIES/mL ESCHERICHIA COLI  Blood culture (routine x 2)     Status: None (Preliminary result)   Collection Time: 04/22/22 12:07 AM   Specimen: BLOOD  Result Value Ref Range Status   Specimen Description BLOOD RIGTH [facility]  Final   Special Requests   Final    BOTTLES DRAWN AEROBIC AND ANAEROBIC Blood Culture results may not be optimal due to an inadequate volume of blood received in culture bottles   Culture   Final    NO GROWTH 2 DAYS Performed at [facility], [address] [place], [place] [address]    Report Status PENDING  Incomplete  Blood culture (routine x 2)     Status: None (Preliminary result)   Collection Time: 04/22/22 12:07 AM   Specimen: BLOOD  Result Value Ref Range Status   Specimen Description BLOOD RIGHT HAND  Final   Special Requests   Final    BOTTLES DRAWN AEROBIC AND ANAEROBIC Blood Culture results may not be optimal due to an inadequate volume of blood received in culture bottles   Culture   Final    NO GROWTH 2 DAYS Performed at [facility], [address]  [place],  [address]    Report Status PENDING  Incomplete    Labs: CBC: Recent Labs  Lab 04/21/22 1249 04/22/22 0007 04/23/22 0412  WBC 17.6* 15.4* 13.3*  NEUTROABS  --  12.1*  --   HGB 9.6* 8.6* 9.4*  HCT 30.8* 28.0* 30.6*  MCV 79.8* 82.1 81.2  PLT 417* 352 947   Basic Metabolic Panel: Recent Labs  Lab 04/21/22 1249 04/22/22 1755 04/23/22 0412  NA 128* 129* 130*  K 5.4* 5.7* 4.6  CL 96* 99 100  CO2 21* 21* 20*  GLUCOSE 145* 116* 112*  BUN 43* 39* 38*  CREATININE 3.08* 2.89* 2.62*  CALCIUM 9.9 9.8 9.4   Liver Function Tests: Recent Labs  Lab 04/21/22 1249  AST 20  ALT 25  ALKPHOS 122  BILITOT 1.0  PROT 7.1  ALBUMIN 2.6*   CBG: Recent Labs  Lab 04/23/22 0747 04/23/22 1145 04/23/22 1635 04/23/22 2031 04/24/22 0728  GLUCAP 118* 114* 130* 140* 124*    Discharge time spent: greater than 30 minutes.  This record has been created  using Systems analyst. Errors have been sought and corrected,but may not always be located. Such creation errors do not reflect on the standard of care.   Signed: Lorella Nimrod, MD [facility] 04/24/2022

## 2022-04-27 LAB — CULTURE, BLOOD (ROUTINE X 2)
Culture: NO GROWTH
Culture: NO GROWTH

## 2022-05-02 ENCOUNTER — Encounter: Payer: Self-pay | Admitting: Cardiology

## 2022-05-02 ENCOUNTER — Ambulatory Visit (INDEPENDENT_AMBULATORY_CARE_PROVIDER_SITE_OTHER): Payer: Medicare Other | Admitting: Cardiology

## 2022-05-02 VITALS — BP 119/79 | HR 123 | Ht 71.0 in | Wt 192.0 lb

## 2022-05-02 DIAGNOSIS — I441 Atrioventricular block, second degree: Secondary | ICD-10-CM | POA: Diagnosis not present

## 2022-05-02 DIAGNOSIS — I4891 Unspecified atrial fibrillation: Secondary | ICD-10-CM | POA: Diagnosis not present

## 2022-05-02 DIAGNOSIS — I10 Essential (primary) hypertension: Secondary | ICD-10-CM

## 2022-05-02 MED ORDER — METOPROLOL TARTRATE 12.5 MG HALF TABLET
50.0000 mg | ORAL_TABLET | Freq: Once | ORAL | Status: AC
  Administered 2022-05-02: 50 mg via ORAL

## 2022-05-02 NOTE — Patient Instructions (Signed)
Medication Instructions:  Your physician recommends that you continue on your current medications as directed. Please refer to the Current Medication list given to you today.  *If you need a refill on your cardiac medications before your next appointment, please call your pharmacy*   Lab Work: None ordered If you have labs (blood work) drawn today and your tests are completely normal, you will receive your results only by: [place] (if you have MyChart) OR A paper copy in the mail If you have any lab test that is abnormal or we need to change your treatment, we will call you to review the results.   Testing/Procedures: None ordered   Follow-Up: At [facility], you and your health needs are our priority.  As part of our continuing mission to provide you with exceptional heart care, we have created designated Provider Care Teams.  These Care Teams include your primary Cardiologist (physician) and Advanced Practice Providers (APPs -  Physician Assistants and Nurse Practitioners) who all work together to provide you with the care you need, when you need it.  We recommend signing up for the patient portal called "MyChart".  Sign up information is provided on this After Visit Summary.  MyChart is used to connect with patients for Virtual Visits (Telemedicine).  Patients are able to view lab/test results, encounter notes, upcoming appointments, etc.  Non-urgent messages can be sent to your provider as well.   To learn more about what you can do with MyChart, go to [email or web address].    Your next appointment:   Follow up as neede3d   The format for your next appointment:   In Person  Provider:   You may see Kate Sable, MD or one of the following Advanced Practice Providers on your designated Care Team:   Murray Hodgkins, NP Christell Faith, PA-C Cadence Kathlen Mody, [place]    Other Instructions   Important Information About Sugar

## 2022-05-02 NOTE — Progress Notes (Signed)
Cardiology Office Note:    Date:  05/02/2022   ID:  Gregory Ramos, DOB Aug 09, 1941, MRN [id number]  PCP:  Annetta Maw, MD   [place] Providers Cardiologist:  None     Referring MD: Lorella Nimrod, MD   Chief Complaint  Patient presents with   NEW patient    Establish cardiac care-heart block    History of Present Illness:    Gregory Ramos is a 81 y.o. male with a hx of hypertension, hyperlipidemia, CKD 4, colon cancer, metastatic prostate cancer, who presents for hospital follow-up.  Previously seen about a year ago 2022 in the hospitals due to GI bleed and Mobitz 1 AV block.  Mobitz 1 occurred in the context of taking beta-blocker.  Beta-blocker was stopped with resolution of symptoms.    Echocardiogram 01/16/2021 showed normal EF 60 to 65%. cardiac monitor 01/2021 showed no high degree AV block.  Patient was recently admitted to the hospital due to UTI, treated with antibiotics and discharged to follow-up with cardiology.  Per daughter, patient has been weak over the past several days.  Initially had abdominal discomfort, placed on and indwelling Foley with improvement in symptoms.  Has blood in stool on recent checks/wipe.  Past Medical History:  Diagnosis Date   Arthritis    Cancer ([place])    Diabetes mellitus without complication ([place])    Hyperlipidemia    Hypertension    Prostate enlargement    Sleep apnea    resolved with weight loss    Past Surgical History:  Procedure Laterality Date   COLON SURGERY     COLON SURGERY     COLONOSCOPY N/A 02/20/2015   Procedure: COLONOSCOPY;  Surgeon: Robert Bellow, MD;  Location: [facility] ENDOSCOPY;  Service: Endoscopy;  Laterality: N/A;   COLONOSCOPY WITH PROPOFOL N/A 01/23/2021   Procedure: COLONOSCOPY WITH PROPOFOL;  Surgeon: Lin Landsman, MD;  Location: [facility] ENDOSCOPY;  Service: Gastroenterology;  Laterality: N/A;   CYST REMOVAL HAND  13086   ESOPHAGOGASTRODUODENOSCOPY (EGD) WITH PROPOFOL N/A  01/23/2021   Procedure: ESOPHAGOGASTRODUODENOSCOPY (EGD) WITH PROPOFOL;  Surgeon: Lin Landsman, MD;  Location: [facility] ENDOSCOPY;  Service: Gastroenterology;  Laterality: N/A;   JOINT REPLACEMENT     hip   NEPHRECTOMY     PROSTATE BIOPSY  2000?   XI ROBOTIC ASSISTED SIMPLE PROSTATECTOMY N/A 01/14/2017   Procedure: XI ROBOTIC ASSISTED SIMPLE PROSTATECTOMY;  Surgeon: Alexis Frock, MD;  Location: [facility] ORS;  Service: Urology;  Laterality: N/A;    Current Medications: Current Meds  Medication Sig   amLODipine (NORVASC) 5 MG tablet Take 5 mg by mouth daily.   darolutamide (NUBEQA) 300 MG tablet Take 600 mg by mouth 2 (two) times daily with a meal.   feeding supplement (ENSURE ENLIVE / ENSURE PLUS) LIQD Take 237 mLs by mouth 3 (three) times daily between meals.   sodium bicarbonate 650 MG tablet Take 1 tablet (650 mg total) by mouth 3 (three) times daily.     Allergies:   Iodinated contrast media   Social History   Socioeconomic History   Marital status: Married    Spouse name: Not on file   Number of children: Not on file   Years of education: Not on file   Highest education level: Not on file  Occupational History   Not on file  Tobacco Use   Smoking status: Former    Packs/day: 1.00    Years: 15.00    Total pack years: 15.00    Types:  Cigarettes   Smokeless tobacco: Never  Vaping Use   Vaping Use: Never used  Substance and Sexual Activity   Alcohol use: No    Alcohol/week: 0.0 standard drinks of alcohol   Drug use: No   Sexual activity: Not on file  Other Topics Concern   Not on file  Social History Narrative   Retired from [employer].  Lives at home wife.     Social Determinants of Health   Financial Resource Strain: Not on file  Food Insecurity: Not on file  Transportation Needs: Not on file  Physical Activity: Not on file  Stress: Not on file  Social Connections: Not on file     Family History: The patient's family history includes Cancer in  his father.  ROS:   Please see the history of present illness.     All other systems reviewed and are negative.  EKGs/Labs/Other Studies Reviewed:    The following studies were reviewed today:   EKG:  EKG is  ordered today.  The ekg ordered today demonstrates A-fib RVR, heart rate 123  Recent Labs: 04/21/2022: ALT 25 04/23/2022: BUN 38; Creatinine, Ser 2.62; Hemoglobin 9.4; Platelets 380; Potassium 4.6; Sodium 130  Recent Lipid Panel    Component Value Date/Time   CHOL 162 03/15/2013 0501   TRIG 68 03/15/2013 0501   HDL 40 03/15/2013 0501   VLDL 14 03/15/2013 0501   LDLCALC 108 (H) 03/15/2013 0501     Risk Assessment/Calculations:        Physical Exam:    VS:  BP 119/79 (BP Location: Left Arm, Patient Position: Sitting, Cuff Size: Normal)   Pulse (!) 123   Ht '5\' 11"'$  (1.803 m)   Wt 192 lb (87.1 kg)   SpO2 96%   BMI 26.78 kg/m     Wt Readings from Last 3 Encounters:  05/02/22 192 lb (87.1 kg)  04/23/22 192 lb 10.9 oz (87.4 kg)  04/06/22 199 lb 15.3 oz (90.7 kg)     GEN: Appears weak, sitting slumped in chair, soft-spoken HEENT: Normal NECK: No JVD; No carotid bruits CARDIAC: Irregular irregular, tachycardic RESPIRATORY: Diminished breath sounds at bases bilaterally ABDOMEN: Soft, non-tender, non-distended MUSCULOSKELETAL:  No edema; lower extremities appear weak SKIN: Warm and dry NEUROLOGIC:  Alert and oriented x 3 PSYCHIATRIC:  Normal affect   ASSESSMENT:    1. Atrial fibrillation, unspecified type ([place])   2. Mobitz I   3. Primary hypertension    PLAN:    In order of problems listed above:  A-fib RVR, new onset.  Given Lopressor 50x1, recommended patient go to the ER, family would like to take patient to [place] since all his treatment team is over there.  Declined any additional prescription meds.  Patient has history of GI bleed, anticoagulation not recommended, last CBC revealed anemia. History of Mobitz 1 block, EKG shows A-fib RVR today,  echo last year with preserved EF. Hypertension, BP controlled, on amlodipine        Medication Adjustments/Labs and Tests Ordered: Current medicines are reviewed at length with the patient today.  Concerns regarding medicines are outlined above.  Orders Placed This Encounter  Procedures   EKG 12-Lead   Meds ordered this encounter  Medications   metoprolol tartrate (LOPRESSOR) tablet 50 mg    Patient Instructions  Medication Instructions:  Your physician recommends that you continue on your current medications as directed. Please refer to the Current Medication list given to you today.  *If you need a  refill on your cardiac medications before your next appointment, please call your pharmacy*   Lab Work: None ordered If you have labs (blood work) drawn today and your tests are completely normal, you will receive your results only by: [place] (if you have MyChart) OR A paper copy in the mail If you have any lab test that is abnormal or we need to change your treatment, we will call you to review the results.   Testing/Procedures: None ordered   Follow-Up: At [facility], you and your health needs are our priority.  As part of our continuing mission to provide you with exceptional heart care, we have created designated Provider Care Teams.  These Care Teams include your primary Cardiologist (physician) and Advanced Practice Providers (APPs -  Physician Assistants and Nurse Practitioners) who all work together to provide you with the care you need, when you need it.  We recommend signing up for the patient portal called "MyChart".  Sign up information is provided on this After Visit Summary.  MyChart is used to connect with patients for Virtual Visits (Telemedicine).  Patients are able to view lab/test results, encounter notes, upcoming appointments, etc.  Non-urgent messages can be sent to your provider as well.   To learn more about what you can do with MyChart, go to  [email or web address].    Your next appointment:   Follow up as neede3d   The format for your next appointment:   In Person  Provider:   You may see Kate Sable, MD or one of the following Advanced Practice Providers on your designated Care Team:   Murray Hodgkins, NP Christell Faith, PA-C Cadence Kathlen Mody, [place]    Other Instructions   Important Information About Sugar         Signed, Kate Sable, MD  05/02/2022 4:55 PM    [place]

## 2022-05-20 ENCOUNTER — Institutional Professional Consult (permissible substitution): Payer: No Typology Code available for payment source | Admitting: Pulmonary Disease

## 2022-05-30 DEATH — deceased

## 2022-10-20 IMAGING — CT CT ABD-PELV W/O CM
2 of 4 series · 14 of 46 positions shown, 16 images · non-contrast
Comparison: CT stone study 03/14/2013.

CLINICAL DATA: Pelvic pain.



[Series 2: routine abd/pel wo · axial · 0.87mm/px · z∈[-880,-395]mm · 11 of 112 slices shown, 13 images]
[im 10/112  soft-tissue]
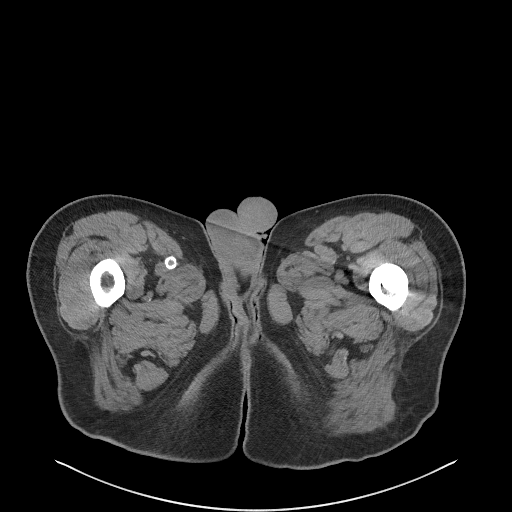
[im 10/112  bone]
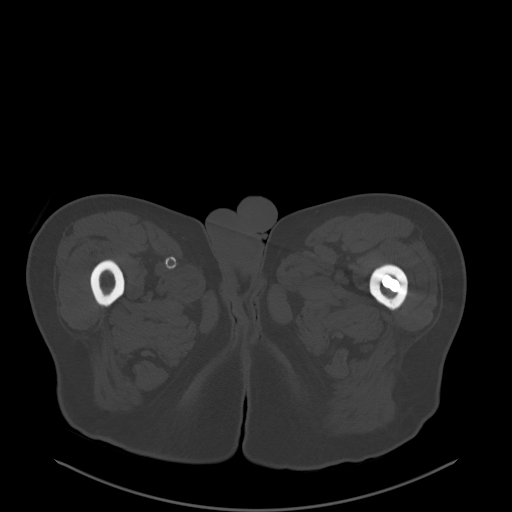
[im 20/112  soft-tissue]
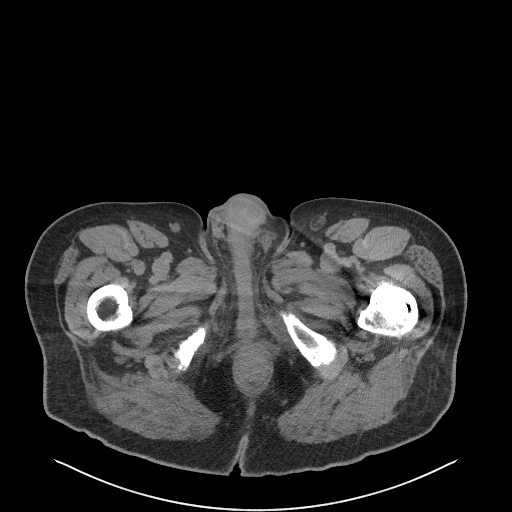
[im 29/112  soft-tissue]
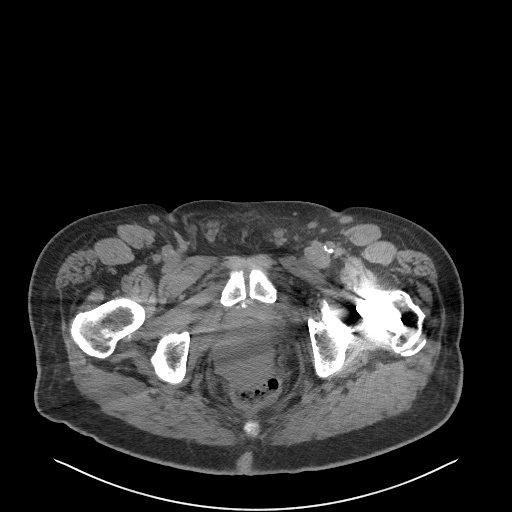
[im 39/112  soft-tissue]
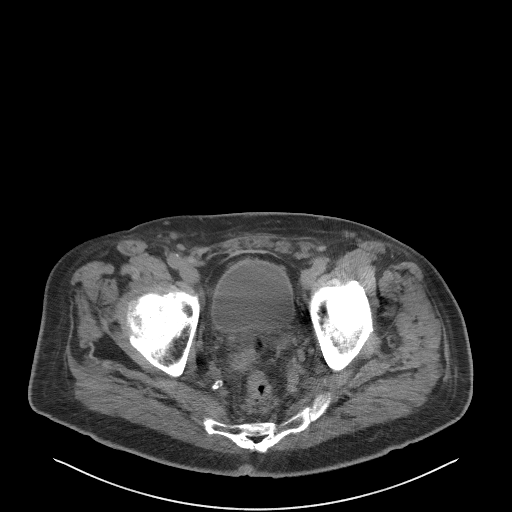
[im 49/112  soft-tissue]
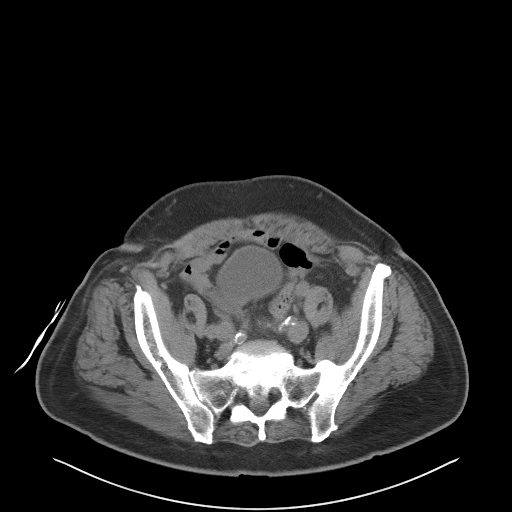
[im 58/112  soft-tissue]
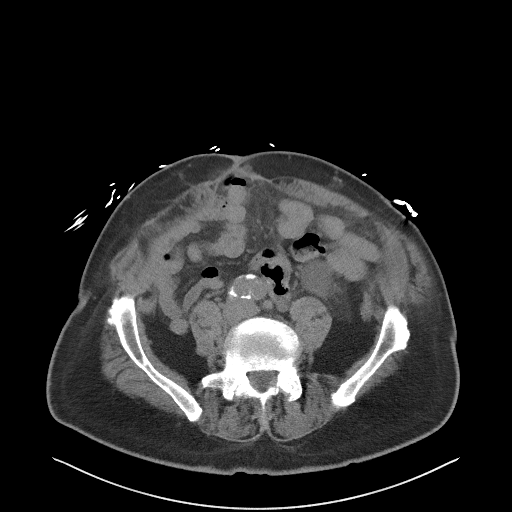
[im 68/112  soft-tissue]
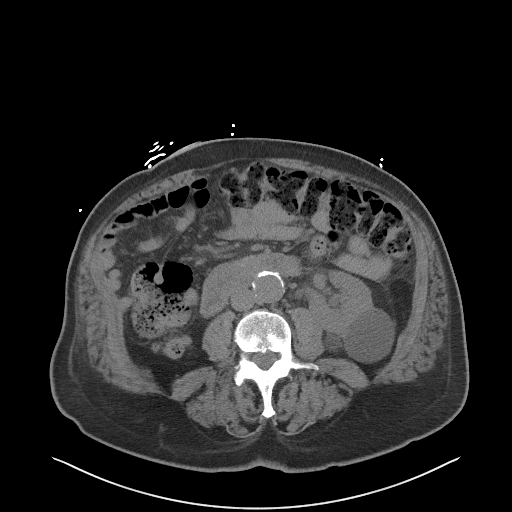
[im 78/112  soft-tissue]
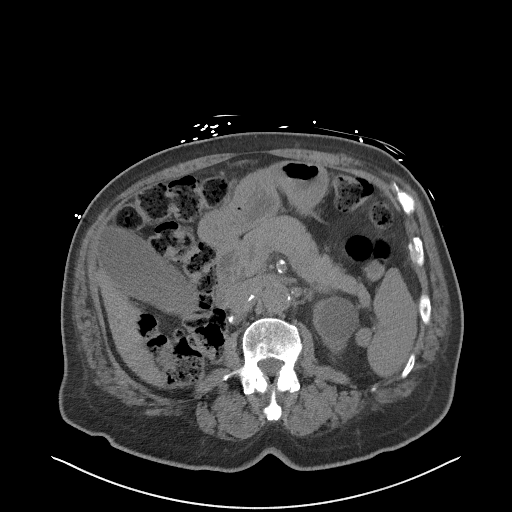
[im 87/112  soft-tissue]
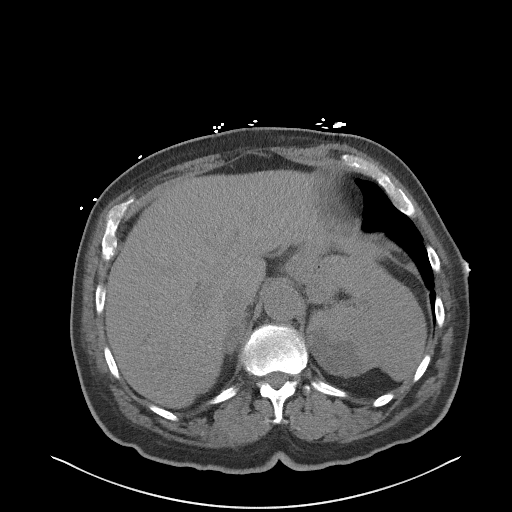
[im 87/112  bone]
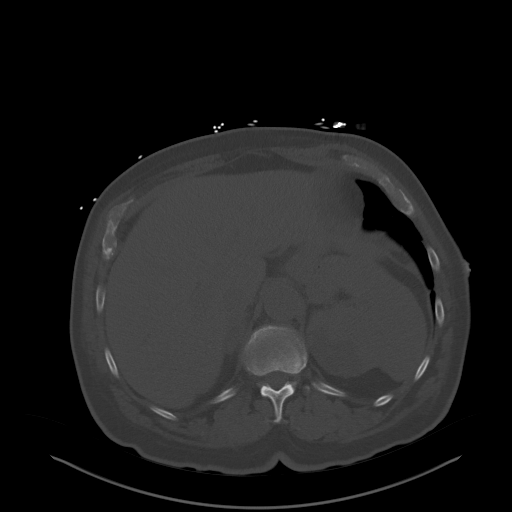
[im 97/112  soft-tissue]
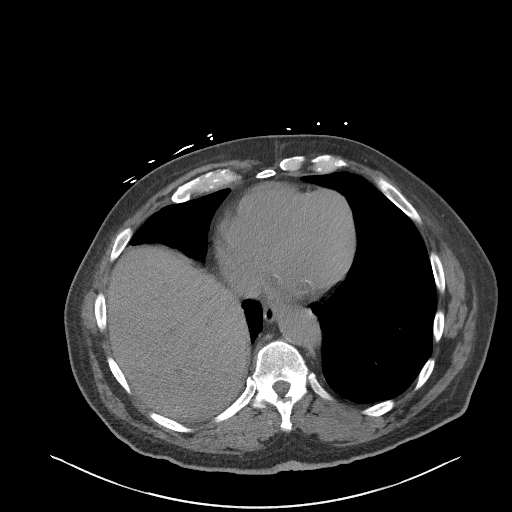
[im 107/112  soft-tissue]
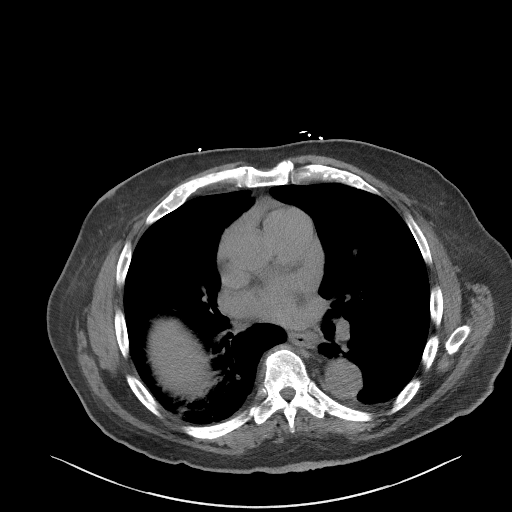

[Series 4: coronal st · coronal · 0.83mm/px · 3 of 105 slices shown]
[im 35/105  soft-tissue]
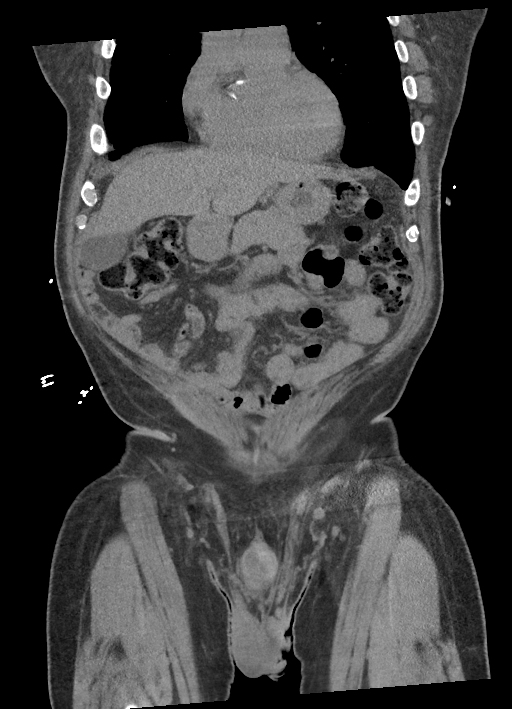
[im 47/105  soft-tissue]
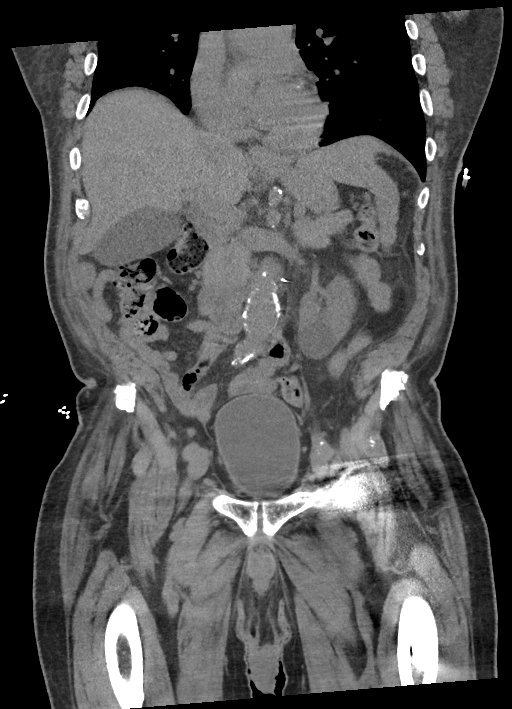
[im 58/105  soft-tissue]
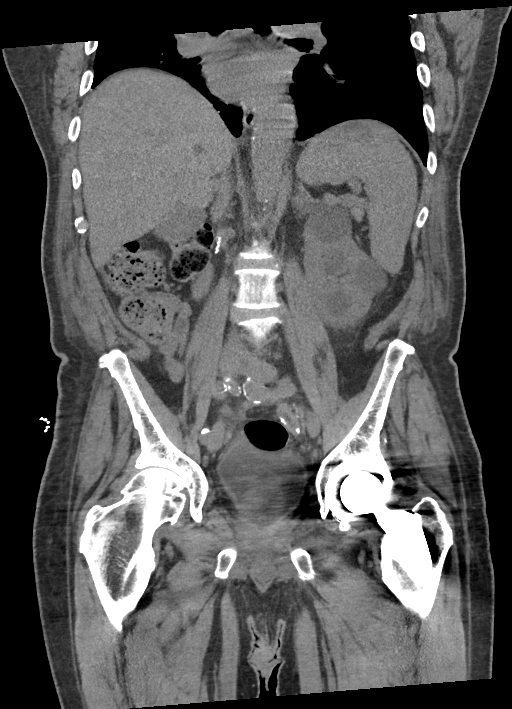

[14 of 46 positions shown; findings below may reference images not displayed]

FINDINGS: Lower chest: Unremarkable.

Hepatobiliary: Several small hypoattenuating lesions are identified
in the liver, too small to characterize but likely benign.
Gallbladder is distended. No intrahepatic or extrahepatic biliary
dilation.

Pancreas: Mild prominence of the main pancreatic duct evident in the
head of the pancreas.

Spleen: 2.1 cm hypoattenuating lesion in the dome of the liver,
increased in the interval. A second medial irregular 5.4 cm
hypoattenuating lesion evident. These were characterized on [facility]
[REDACTED] MRI abdomen 12/05/2021 with report available in
[REDACTED] [REDACTED].

Adrenals/Urinary Tract: Thickening of both adrenal glands is similar
to prior with low attenuation suggesting adenomatous hyperplasia.
Patient is status post right nephrectomy. Multiple low-density left
renal lesions evident including 4.9 cm exophytic interpolar lesion
measuring water density and likely a cyst. This has shown interval
enlargement. Additional lesions in the lower pole are also
progressive since prior and while the cannot be definitively
characterize have low attenuation suggesting cyst. No left
hydroureteronephrosis. Bladder is partially obscured by beam
hardening artifact from left hip replacement but no substantial wall
thickening evident.

Stomach/Bowel: Stomach is unremarkable. No gastric wall thickening.
No evidence of outlet obstruction. Duodenum is normally positioned
as is the ligament of Treitz. No small bowel wall thickening. No
small bowel dilatation. Left hemicolectomy with rectosigmoid
anastomosis.

Vascular/Lymphatic: There is moderate atherosclerotic calcification
of the abdominal aorta without aneurysm. Infrarenal abdominal aorta
measures up to 3.2 cm diameter. There is no gastrohepatic or
hepatoduodenal ligament lymphadenopathy. No retroperitoneal or
mesenteric lymphadenopathy. No pelvic sidewall lymphadenopathy.

Reproductive: Prostate gland is enlarged as before although the soft
tissue appears somewhat asymmetric to the right on today's study,
new in the interval. Assessment limited by beam hardening artifact
from left hip replacement.

Other: No intraperitoneal free fluid.

Musculoskeletal: Right paraumbilical ventral hernia contains a short
segment of small bowel without bowel wall thickening or evidence of
small-bowel obstruction. No evidence for left groin hernia. Status
post left total hip replacement. No worrisome lytic or sclerotic
osseous abnormality.
IMPRESSION: 1. No acute findings in the abdomen or pelvis. Specifically, no
findings to explain the patient's history of pelvic pain.
2. Status post right nephrectomy.
3. Multiple low-density left renal lesions, progressive since prior
study. These are not well characterized on noncontrast imaging but
measure near water density suggesting cyst. Patient had MRI abdomen
at [REDACTED] on 12/05/2021 with report available in
[REDACTED] [REDACTED].
4. Interval enlargement of hypoattenuating lesions in the dome of
the liver. These were characterized on [REDACTED] MRI
abdomen 12/05/2021 with report available in [REDACTED].
5. Right paraumbilical ventral hernia contains a short segment of
small bowel without bowel wall thickening or evidence of small-bowel
obstruction.
6. Abdominal aortic aneurysm measuring 3.2 cm diameter. Recommend
follow-up ultrasound every 3 years. This recommendation follows ACR
consensus guidelines: White Paper of the ACR Incidental Findings
Committee II on Vascular Findings. [HOSPITAL] 2875;
[DATE].
7. Prostatomegaly with prostate parenchyma deviated to the right on
today's study, new since 03/14/2013 and of indeterminate
significance.
8. Aortic Atherosclerosis ([id number]-L9Z.Z).
